# Patient Record
Sex: Female | Born: 1980 | Race: Asian | Hispanic: No | Marital: Single | Smoking: Current every day smoker
Health system: Southern US, Community
[De-identification: ages and names within clinical notes are randomized; demographics above are authoritative.]

## PROBLEM LIST (undated history)

## (undated) DIAGNOSIS — G5600 Carpal tunnel syndrome, unspecified upper limb: Secondary | ICD-10-CM

## (undated) DIAGNOSIS — R87629 Unspecified abnormal cytological findings in specimens from vagina: Secondary | ICD-10-CM

## (undated) DIAGNOSIS — J45909 Unspecified asthma, uncomplicated: Secondary | ICD-10-CM

## (undated) HISTORY — DX: Unspecified abnormal cytological findings in specimens from vagina: R87.629

## (undated) HISTORY — DX: Unspecified asthma, uncomplicated: J45.909

## (undated) HISTORY — PX: COLPOSCOPY: SHX161

## (undated) HISTORY — PX: TONSILLECTOMY: SUR1361

---
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
## 2004-09-15 ENCOUNTER — Emergency Department (HOSPITAL_COMMUNITY): Admission: EM | Admit: 2004-09-15 | Discharge: 2004-09-15 | Payer: Self-pay | Admitting: Family Medicine

## 2004-12-13 ENCOUNTER — Emergency Department (HOSPITAL_COMMUNITY): Admission: EM | Admit: 2004-12-13 | Discharge: 2004-12-13 | Payer: Self-pay | Admitting: *Deleted

## 2005-08-08 ENCOUNTER — Emergency Department (HOSPITAL_COMMUNITY): Admission: EM | Admit: 2005-08-08 | Discharge: 2005-08-08 | Payer: Self-pay | Admitting: Emergency Medicine

## 2005-10-11 ENCOUNTER — Inpatient Hospital Stay (HOSPITAL_COMMUNITY): Admission: AD | Admit: 2005-10-11 | Discharge: 2005-10-12 | Payer: Self-pay | Admitting: *Deleted

## 2005-10-19 ENCOUNTER — Inpatient Hospital Stay (HOSPITAL_COMMUNITY): Admission: RE | Admit: 2005-10-19 | Discharge: 2005-10-19 | Payer: Self-pay | Admitting: *Deleted

## 2005-12-27 ENCOUNTER — Inpatient Hospital Stay (HOSPITAL_COMMUNITY): Admission: AD | Admit: 2005-12-27 | Discharge: 2005-12-27 | Payer: Self-pay | Admitting: *Deleted

## 2006-01-10 ENCOUNTER — Ambulatory Visit: Payer: Self-pay | Admitting: *Deleted

## 2006-01-10 ENCOUNTER — Ambulatory Visit (HOSPITAL_COMMUNITY): Admission: RE | Admit: 2006-01-10 | Discharge: 2006-01-10 | Payer: Self-pay | Admitting: *Deleted

## 2006-01-30 ENCOUNTER — Ambulatory Visit (HOSPITAL_COMMUNITY): Admission: RE | Admit: 2006-01-30 | Discharge: 2006-01-30 | Payer: Self-pay | Admitting: *Deleted

## 2006-03-13 ENCOUNTER — Ambulatory Visit (HOSPITAL_COMMUNITY): Admission: RE | Admit: 2006-03-13 | Discharge: 2006-03-13 | Payer: Self-pay | Admitting: Obstetrics & Gynecology

## 2006-04-26 ENCOUNTER — Inpatient Hospital Stay (HOSPITAL_COMMUNITY): Admission: AD | Admit: 2006-04-26 | Discharge: 2006-04-26 | Payer: Self-pay | Admitting: Gynecology

## 2006-04-26 ENCOUNTER — Ambulatory Visit: Payer: Self-pay | Admitting: Certified Nurse Midwife

## 2006-06-01 ENCOUNTER — Inpatient Hospital Stay (HOSPITAL_COMMUNITY): Admission: AD | Admit: 2006-06-01 | Discharge: 2006-06-02 | Payer: Self-pay | Admitting: Obstetrics & Gynecology

## 2006-06-08 ENCOUNTER — Ambulatory Visit: Payer: Self-pay | Admitting: Family Medicine

## 2006-06-08 ENCOUNTER — Inpatient Hospital Stay (HOSPITAL_COMMUNITY): Admission: AD | Admit: 2006-06-08 | Discharge: 2006-06-10 | Payer: Self-pay | Admitting: Obstetrics and Gynecology

## 2009-01-05 ENCOUNTER — Emergency Department (HOSPITAL_COMMUNITY): Admission: EM | Admit: 2009-01-05 | Discharge: 2009-01-05 | Payer: Self-pay | Admitting: Family Medicine

## 2009-04-06 ENCOUNTER — Emergency Department (HOSPITAL_COMMUNITY): Admission: EM | Admit: 2009-04-06 | Discharge: 2009-04-06 | Payer: Self-pay | Admitting: Family Medicine

## 2009-04-23 ENCOUNTER — Encounter: Admission: RE | Admit: 2009-04-23 | Discharge: 2009-05-15 | Payer: Self-pay | Admitting: Orthopedic Surgery

## 2009-07-23 ENCOUNTER — Emergency Department (HOSPITAL_COMMUNITY): Admission: EM | Admit: 2009-07-23 | Discharge: 2009-07-23 | Payer: Self-pay | Admitting: Emergency Medicine

## 2009-12-11 ENCOUNTER — Emergency Department (HOSPITAL_COMMUNITY): Admission: EM | Admit: 2009-12-11 | Discharge: 2009-12-11 | Payer: Self-pay | Admitting: Family Medicine

## 2010-01-04 ENCOUNTER — Ambulatory Visit (HOSPITAL_COMMUNITY): Admission: RE | Admit: 2010-01-04 | Discharge: 2010-01-04 | Payer: Self-pay | Admitting: Orthopedic Surgery

## 2010-12-30 LAB — POCT URINALYSIS DIP (DEVICE)
Bilirubin Urine: NEGATIVE
Glucose, UA: NEGATIVE mg/dL
Hgb urine dipstick: NEGATIVE
Ketones, ur: NEGATIVE mg/dL
Nitrite: NEGATIVE
Protein, ur: NEGATIVE mg/dL
Specific Gravity, Urine: 1.015 (ref 1.005–1.030)
Urobilinogen, UA: 0.2 mg/dL (ref 0.0–1.0)
pH: 6 (ref 5.0–8.0)

## 2010-12-30 LAB — POCT PREGNANCY, URINE: Preg Test, Ur: NEGATIVE

## 2011-06-01 ENCOUNTER — Encounter (HOSPITAL_COMMUNITY)
Admission: RE | Admit: 2011-06-01 | Discharge: 2011-06-01 | Disposition: A | Discharge status: Home / Self Care | Payer: Medicaid Other | Source: Ambulatory Visit | Attending: Otolaryngology | Admitting: Otolaryngology

## 2011-06-01 LAB — URINALYSIS, ROUTINE W REFLEX MICROSCOPIC
Bilirubin Urine: NEGATIVE
Glucose, UA: NEGATIVE mg/dL
Ketones, ur: NEGATIVE mg/dL
Nitrite: NEGATIVE
Protein, ur: NEGATIVE mg/dL
Specific Gravity, Urine: 1.025 (ref 1.005–1.030)
Urobilinogen, UA: 0.2 mg/dL (ref 0.0–1.0)
pH: 5.5 (ref 5.0–8.0)

## 2011-06-01 LAB — URINE MICROSCOPIC-ADD ON

## 2011-06-01 LAB — SURGICAL PCR SCREEN
MRSA, PCR: NEGATIVE
Staphylococcus aureus: NEGATIVE

## 2011-06-01 LAB — CBC
HCT: 42.7 % (ref 36.0–46.0)
Hemoglobin: 15 g/dL (ref 12.0–15.0)
MCH: 31.8 pg (ref 26.0–34.0)
MCHC: 35.1 g/dL (ref 30.0–36.0)
MCV: 90.5 fL (ref 78.0–100.0)
Platelets: 322 10*3/uL (ref 150–400)
RBC: 4.72 MIL/uL (ref 3.87–5.11)
RDW: 11.7 % (ref 11.5–15.5)
WBC: 13 10*3/uL — ABNORMAL HIGH (ref 4.0–10.5)

## 2011-06-01 LAB — HCG, SERUM, QUALITATIVE: Preg, Serum: NEGATIVE

## 2011-06-06 ENCOUNTER — Other Ambulatory Visit: Payer: Self-pay | Admitting: Otolaryngology

## 2011-06-06 ENCOUNTER — Ambulatory Visit (HOSPITAL_COMMUNITY)
Admission: RE | Admit: 2011-06-06 | Discharge: 2011-06-07 | Disposition: A | Discharge status: Home / Self Care | Payer: Medicaid Other | Source: Ambulatory Visit | Attending: Otolaryngology | Admitting: Otolaryngology

## 2011-06-06 DIAGNOSIS — J342 Deviated nasal septum: Secondary | ICD-10-CM | POA: Insufficient documentation

## 2011-06-06 DIAGNOSIS — F17200 Nicotine dependence, unspecified, uncomplicated: Secondary | ICD-10-CM | POA: Insufficient documentation

## 2011-06-06 DIAGNOSIS — F411 Generalized anxiety disorder: Secondary | ICD-10-CM | POA: Insufficient documentation

## 2011-06-06 DIAGNOSIS — J3501 Chronic tonsillitis: Secondary | ICD-10-CM | POA: Insufficient documentation

## 2011-06-06 DIAGNOSIS — J309 Allergic rhinitis, unspecified: Secondary | ICD-10-CM | POA: Insufficient documentation

## 2011-06-06 DIAGNOSIS — Z01812 Encounter for preprocedural laboratory examination: Secondary | ICD-10-CM | POA: Insufficient documentation

## 2011-06-06 DIAGNOSIS — J343 Hypertrophy of nasal turbinates: Secondary | ICD-10-CM | POA: Insufficient documentation
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
## 2011-06-17 NOTE — Discharge Summary (Signed)
[name], [name] NO.:  [id number]  MEDICAL RECORD NO.:  [id number]  LOCATION:  5121                         FACILITY:  [facility]  PHYSICIAN:  Hermelinda Medicus, M.D.   DATE OF BIRTH:  01-05-1981  DATE OF ADMISSION:  06/06/2011 DATE OF DISCHARGE:  06/07/2011                              DISCHARGE SUMMARY   This patient is a 30 year old female who has had considerable allergy issues.  She had been seen by Dr. 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  She also has very large tonsils and they have been infected at intermittent times having tonsillitis, but also the size of the tonsils have led to snoring issues plus further issues resolving her breathing.  She now enters for a septal reconstruction, turbinate reduction, and a tonsillectomy.  She will be on 23-hour observation because of her history of snoring and the potential airway issues.  She will be on pulse oximeter.  FAMILY HISTORY:  History of breast cancer and blood pressure elevation.  REVIEW OF SYSTEMS:  Unremarkable.  She is a healthy lady except for the allergic rhinitis problems.  PHYSICAL EXAMINATION:  VITAL SIGNS:  Her blood pressure is 128/68, pulse 79.  Her weight is essentially normal. ENT:  Ears are clear.  Tympanic membranes are moving well and are clear. The nose shows a severe septal deviation with deviation to the right and the left, typical of a previous hematoma within the septum in the ethmoid region.  She also has a lot of evidence of allergic rhinitis with turbinate hypertrophy and edema of her turbinates.  No evidence of any ulcer, mass, lesion, or polyps.  Nasopharynx is clear, but her tonsils are 3-4+ in size with a  small nose and a small oral cavity. NECK:  Free of any thyromegaly, cervical adenopathy, or mass, but she does have somewhat of a full neck which pushes her tongue high in her mouth.  Her oral cavity is otherwise clear. CHEST:  Clear.  No rales, rhonchi, or wheezes. CARDIOVASCULAR:  No murmurs or gallops. EXTREMITIES:  Unremarkable. NEUROLOGIC:  Unremarkable, oriented x3.  INITIAL DIAGNOSES: 1. Septal deviation. 2. Turbinate hypertrophy. 3. Allergic rhinitis. 4. Tonsillitis with tonsillar hypertrophy.  Her laboratory reveals a slightly high white count of 13.0 secondary to previous sinusitis.  She is negative for Staph aureus and MRSA.  The remainder of her lab really looks quite good.  She was taken to surgery as a 23-hour observation and underwent a septal reconstruction, turbinate reduction with reduction of concha bullosa which are very large hollow middle turbinates and septal reconstruction.  Also, she underwent a tonsillectomy.  The patient had an anesthesia trumpet placed in her nose on one side and a Merocel pack in the other, was placed on 23-hour observation with continuous pulse oximeter and has done very well.  Packing was removed this morning.  Her lowest O2 was 94% on room air.  Her blood pressure now is 112/66, pulse 20, rate 56.  Per Cardiology evaluation, normal sinus rhythm and she is now going to be discharged and will be followed in my office in 3 days and then in 10 days and 3 weeks, 6 weeks, 3 months, and a year.  FINAL DIAGNOSES: 1. Septal deviation. 2. Turbinate hypertrophy. 3. Concha bullosa. 4. Allergic rhinitis with tonsillar hypertrophy with snoring with     tonsillitis.  The patient is doing very well.          ______________________________ Hermelinda Medicus, M.D.     JC/MEDQ  D:  06/07/2011  T:  06/07/2011  Job:  [id number]  Electronically Signed by Hermelinda Medicus M.D. on 06/17/2011 09:33:15 AM

## 2011-10-21 ENCOUNTER — Emergency Department (INDEPENDENT_AMBULATORY_CARE_PROVIDER_SITE_OTHER)
Admission: EM | Admit: 2011-10-21 | Discharge: 2011-10-21 | Disposition: A | Discharge status: Home / Self Care | Payer: Self-pay | Source: Home / Self Care | Attending: Emergency Medicine | Admitting: Emergency Medicine

## 2011-10-21 ENCOUNTER — Encounter (HOSPITAL_COMMUNITY): Payer: Self-pay | Admitting: *Deleted

## 2011-10-21 DIAGNOSIS — S139XXA Sprain of joints and ligaments of unspecified parts of neck, initial encounter: Secondary | ICD-10-CM

## 2011-10-21 DIAGNOSIS — S161XXA Strain of muscle, fascia and tendon at neck level, initial encounter: Secondary | ICD-10-CM

## 2011-10-21 MED ORDER — MELOXICAM 15 MG PO TABS: 15.0000 mg | ORAL_TABLET | Freq: Every day | ORAL | Status: DC
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
## 2011-10-21 NOTE — ED Notes (Signed)
pT  REPORTS  SYMPTOMS  OF  BACK  AND  NECK  PAIN  SHE  IS  SITTING  COMFORTABLY  IN CHAIR  SPEAKING IN  COMPLETE  SENTANCES  AND  IS  IN NO  DISTRESS
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
## 2011-10-21 NOTE — ED Provider Notes (Signed)
Medical screening examination/treatment/procedure(s) were performed by a resident physician and as supervising physician I was immediately available for consultation/collaboration.  Hillery Hunter, MD 10/21/11 [phone]

## 2012-01-25 ENCOUNTER — Ambulatory Visit
Payer: No Typology Code available for payment source | Attending: Family Medicine | Admitting: Rehabilitative and Restorative Service Providers"

## 2012-01-25 DIAGNOSIS — IMO0001 Reserved for inherently not codable concepts without codable children: Secondary | ICD-10-CM | POA: Insufficient documentation

## 2012-01-25 DIAGNOSIS — M2560 Stiffness of unspecified joint, not elsewhere classified: Secondary | ICD-10-CM | POA: Insufficient documentation

## 2012-01-25 DIAGNOSIS — R293 Abnormal posture: Secondary | ICD-10-CM | POA: Insufficient documentation

## 2012-01-25 DIAGNOSIS — M542 Cervicalgia: Secondary | ICD-10-CM | POA: Insufficient documentation

## 2012-01-30 ENCOUNTER — Encounter: Payer: Self-pay | Admitting: Physical Therapy

## 2012-02-01 ENCOUNTER — Encounter: Payer: Self-pay | Admitting: Physical Therapy

## 2012-02-06 ENCOUNTER — Ambulatory Visit: Payer: No Typology Code available for payment source | Admitting: Physical Therapy

## 2012-02-08 ENCOUNTER — Ambulatory Visit: Payer: No Typology Code available for payment source

## 2012-02-14 ENCOUNTER — Ambulatory Visit: Payer: No Typology Code available for payment source | Admitting: Rehabilitative and Restorative Service Providers"

## 2012-02-16 ENCOUNTER — Encounter: Payer: Self-pay | Admitting: Rehabilitative and Restorative Service Providers"

## 2012-02-21 ENCOUNTER — Ambulatory Visit: Payer: No Typology Code available for payment source | Admitting: Rehabilitative and Restorative Service Providers"

## 2012-02-23 ENCOUNTER — Encounter: Payer: Self-pay | Admitting: Rehabilitation

## 2012-02-27 ENCOUNTER — Ambulatory Visit
Payer: No Typology Code available for payment source | Attending: Family Medicine | Admitting: Rehabilitative and Restorative Service Providers"

## 2012-02-27 DIAGNOSIS — M542 Cervicalgia: Secondary | ICD-10-CM | POA: Insufficient documentation

## 2012-02-27 DIAGNOSIS — M2560 Stiffness of unspecified joint, not elsewhere classified: Secondary | ICD-10-CM | POA: Insufficient documentation

## 2012-02-27 DIAGNOSIS — R293 Abnormal posture: Secondary | ICD-10-CM | POA: Insufficient documentation

## 2012-02-27 DIAGNOSIS — IMO0001 Reserved for inherently not codable concepts without codable children: Secondary | ICD-10-CM | POA: Insufficient documentation

## 2012-02-29 ENCOUNTER — Ambulatory Visit: Payer: No Typology Code available for payment source | Admitting: Rehabilitative and Restorative Service Providers"

## 2012-03-07 ENCOUNTER — Ambulatory Visit: Payer: No Typology Code available for payment source | Admitting: Rehabilitative and Restorative Service Providers"

## 2012-03-09 ENCOUNTER — Ambulatory Visit: Payer: No Typology Code available for payment source

## 2012-03-13 ENCOUNTER — Ambulatory Visit: Payer: No Typology Code available for payment source

## 2012-03-15 ENCOUNTER — Ambulatory Visit: Payer: No Typology Code available for payment source

## 2012-05-03 ENCOUNTER — Ambulatory Visit: Payer: No Typology Code available for payment source | Admitting: Physical Therapy

## 2012-06-17 ENCOUNTER — Emergency Department (HOSPITAL_COMMUNITY)
Admission: EM | Admit: 2012-06-17 | Discharge: 2012-06-17 | Disposition: A | Discharge status: Home / Self Care | Payer: Medicaid Other | Attending: Emergency Medicine | Admitting: Emergency Medicine

## 2012-06-17 ENCOUNTER — Encounter (HOSPITAL_COMMUNITY): Payer: Self-pay | Admitting: *Deleted

## 2012-06-17 ENCOUNTER — Emergency Department (HOSPITAL_COMMUNITY): Payer: Medicaid Other

## 2012-06-17 DIAGNOSIS — X500XXA Overexertion from strenuous movement or load, initial encounter: Secondary | ICD-10-CM | POA: Insufficient documentation

## 2012-06-17 DIAGNOSIS — Y9301 Activity, walking, marching and hiking: Secondary | ICD-10-CM | POA: Insufficient documentation

## 2012-06-17 DIAGNOSIS — S93409A Sprain of unspecified ligament of unspecified ankle, initial encounter: Secondary | ICD-10-CM

## 2012-06-17 DIAGNOSIS — Y998 Other external cause status: Secondary | ICD-10-CM | POA: Insufficient documentation

## 2012-06-17 DIAGNOSIS — F17200 Nicotine dependence, unspecified, uncomplicated: Secondary | ICD-10-CM | POA: Insufficient documentation

## 2012-06-17 DIAGNOSIS — S8990XA Unspecified injury of unspecified lower leg, initial encounter: Secondary | ICD-10-CM | POA: Insufficient documentation

## 2012-06-17 MED ORDER — IBUPROFEN 800 MG PO TABS: 800.0000 mg | ORAL_TABLET | Freq: Three times a day (TID) | ORAL | Status: DC | PRN

## 2012-06-17 NOTE — ED Provider Notes (Signed)
History     CSN: [id number]  Arrival date & time 06/17/12  0520   First MD Initiated Contact with Patient 06/17/12 0831      Chief Complaint  Patient presents with  . Ankle Pain    (Consider location/radiation/quality/duration/timing/severity/associated sxs/prior treatment) HPI Patient presents to the emergency department with right ankle pain, following a fall.  Patient, states, that she was walking when she twisted her ankle and fell.  Patient, states, that she's not able to put weight on her right ankle.  The patient denies numbness or weakness in her foot.  Patient did not take any medications prior to arrival, for her discomfort.  Patient, states, that movement and palpation make the pain, worse.     Marland KitchenHistory reviewed. No pertinent past medical history.  Past Surgical History  Procedure Date  . Tonsillectomy     History reviewed. No pertinent family history.  History  Substance Use Topics  . Smoking status: Current Every Day Smoker  . Smokeless tobacco: Not on file  . Alcohol Use: Yes    OB History    Grav Para Term Preterm Abortions TAB SAB Ect Mult Living                  Review of Systems All other systems negative except as documented in the HPI. All pertinent positives and negatives as reviewed in the HPI.  Allergies  Review of patient's allergies indicates no known allergies.  Home Medications  No current outpatient prescriptions on file.  BP 128/73  Pulse 99  Temp 98.9 F (37.2 C) (Oral)  Resp 17  Ht 5\' 5"  (1.651 m)  Wt 215 lb (97.523 kg)  BMI 35.78 kg/m2  SpO2 95%  Physical Exam  Nursing note and vitals reviewed. HENT:  Head: Normocephalic and atraumatic.  Cardiovascular: Normal rate and regular rhythm.   Pulmonary/Chest: Effort normal and breath sounds normal.  Musculoskeletal:       Right ankle: She exhibits decreased range of motion and swelling. She exhibits no ecchymosis, no deformity and normal pulse. tenderness. Lateral  malleolus tenderness found. Achilles tendon normal.    ED Course  Procedures (including critical care time)  Labs Reviewed - No data to display Dg Ankle Complete Right  06/17/2012  *RADIOLOGY REPORT*  Clinical Data: Ankle pain  RIGHT ANKLE - COMPLETE 3+ VIEW  Comparison: None  Findings: There is no evidence of fracture or dislocation.  There is no evidence of arthropathy or other focal bone abnormality. Soft tissues are unremarkable.  IMPRESSION: Negative exam.   Original Report Authenticated By: Rosealee Albee, M.D.     The patient will be seen by the orthopedist this coming week for some back troubles and I advised her to followup with him for this as well.  Patient is placed in an ASO with crutches.  She is told to return here for any worsening in her condition.     MDM          Carlyle Dolly, PA-C 06/17/12 [phone]
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
## 2012-06-17 NOTE — ED Provider Notes (Signed)
Medical screening examination/treatment/procedure(s) were performed by non-physician practitioner and as supervising physician I was immediately available for consultation/collaboration.  Asley Baskerville T Ebony Yorio, MD 06/17/12 1503 

## 2012-06-17 NOTE — ED Notes (Signed)
Splint applied and crutch walking, pt able to demonstrate crutch walking, aware to follow up with ortho

## 2013-02-05 ENCOUNTER — Emergency Department (HOSPITAL_COMMUNITY)
Admission: EM | Admit: 2013-02-05 | Discharge: 2013-02-05 | Disposition: A | Discharge status: Home / Self Care | Payer: Medicaid Other | Source: Home / Self Care

## 2013-02-05 ENCOUNTER — Encounter (HOSPITAL_COMMUNITY): Payer: Self-pay | Admitting: *Deleted

## 2013-02-05 DIAGNOSIS — L253 Unspecified contact dermatitis due to other chemical products: Secondary | ICD-10-CM

## 2013-02-05 MED ORDER — FLUTICASONE PROPIONATE 0.05 % EX CREA: TOPICAL_CREAM | Freq: Two times a day (BID) | CUTANEOUS | Status: DC

## 2013-02-05 NOTE — ED Provider Notes (Signed)
History     CSN: [id number]  Arrival date & time 02/05/13  1547   None     Chief Complaint  Patient presents with  . Rash    (Consider location/radiation/quality/duration/timing/severity/associated sxs/prior treatment) Patient is a 32 y.o. female presenting with rash. The history is provided by the patient.  Rash Location:  Hand Hand rash location:  L hand and R hand Quality: itchiness and redness   Severity:  Mild Duration:  2 weeks Timing:  Constant Progression:  Unchanged Chronicity:  New Relieved by:  None tried   History reviewed. No pertinent past medical history.  Past Surgical History  Procedure Laterality Date  . Tonsillectomy      Family History  Problem Relation Age of Onset  . Cancer Mother   . Hypertension Mother     History  Substance Use Topics  . Smoking status: Current Every Day Smoker  . Smokeless tobacco: Never Used  . Alcohol Use: Yes    OB History   Grav Para Term Preterm Abortions TAB SAB Ect Mult Living                  Review of Systems  Constitutional: Negative.   Skin: Positive for rash.    Allergies  Review of patient's allergies indicates no known allergies.  Home Medications   Current Outpatient Rx  Name  Route  Sig  Dispense  Refill  . fluticasone (CUTIVATE) 0.05 % cream   Topical   Apply topically 2 (two) times daily.   30 g   1   . ibuprofen (ADVIL,MOTRIN) 800 MG tablet   Oral   Take 1 tablet (800 mg total) by mouth every 8 (eight) hours as needed for pain.   21 tablet   0     BP 109/83  Pulse 80  Temp(Src) 97.5 F (36.4 C) (Oral)  SpO2 92%  Physical Exam  Nursing note and vitals reviewed. Constitutional: She is oriented to person, place, and time. She appears well-developed and well-nourished.  Neurological: She is alert and oriented to person, place, and time.  Skin: Skin is warm and dry. Rash noted.  Mild erythema between fingers of both hands, no discrete blisters or lesions.    ED Course   Procedures (including critical care time)  Labs Reviewed - No data to display No results found.   1. Contact dermatitis due to chemicals       MDM          Linna Hoff, MD 02/05/13 1730

## 2013-02-05 NOTE — ED Notes (Signed)
Itching rash between fingers x 2 weeks

## 2013-10-13 ENCOUNTER — Encounter (HOSPITAL_COMMUNITY): Payer: Self-pay | Admitting: Emergency Medicine

## 2013-10-13 ENCOUNTER — Emergency Department (HOSPITAL_COMMUNITY)
Admission: EM | Admit: 2013-10-13 | Discharge: 2013-10-13 | Disposition: A | Discharge status: Home / Self Care | Payer: Medicaid Other | Source: Home / Self Care | Attending: Emergency Medicine | Admitting: Emergency Medicine

## 2013-10-13 DIAGNOSIS — J329 Chronic sinusitis, unspecified: Secondary | ICD-10-CM

## 2013-10-13 MED ORDER — AMOXICILLIN 500 MG PO CAPS: 500.0000 mg | ORAL_CAPSULE | Freq: Two times a day (BID) | ORAL | Status: DC

## 2013-10-13 NOTE — Discharge Instructions (Signed)
Use your saline rinses and fluticasone nasal spray. Get some sudafed (generic pseudoephedrine is ok to use) from the pharmacist and take it as directed on the package. Finish all of the antibiotics, even if you are feeling better.    Sinusitis Sinusitis is redness, soreness, and puffiness (inflammation) of the air pockets in the bones of your face (sinuses). The redness, soreness, and puffiness can cause air and mucus to get trapped in your sinuses. This can allow germs to grow and cause an infection.  HOME CARE   Drink enough fluids to keep your pee (urine) clear or pale yellow.  Use a humidifier in your home.  Run a hot shower to create steam in the bathroom. Sit in the bathroom with the door closed. Breathe in the steam 3 4 times a day.  Put a warm, moist washcloth on your face 3 4 times a day, or as told by your doctor.  Use salt water sprays (saline sprays) to wet the thick fluid in your nose. This can help the sinuses drain.  Only take medicine as told by your doctor. GET HELP RIGHT AWAY IF:   Your pain gets worse.  You have very bad headaches.  You are sick to your stomach (nauseous).  You throw up (vomit).  You are very sleepy (drowsy) all the time.  Your face is puffy (swollen).  Your vision changes.  You have a stiff neck.  You have trouble breathing. MAKE SURE YOU:   Understand these instructions.  Will watch your condition.  Will get help right away if you are not doing well or get worse. Document Released: 02/29/2008 Document Revised: 06/06/2012 Document Reviewed: 04/17/2012 [facility] Patient Information 2014 [place].

## 2013-10-13 NOTE — ED Notes (Signed)
Reported 2 week duration of URI type syx that have not improved w OTC medications; used her sons MDI for her reported SOB earlier today. SPO2  95% on RA

## 2013-10-13 NOTE — ED Provider Notes (Signed)
CSN: [id number]     Arrival date & time 10/13/13  1650 History   First MD Initiated Contact with Patient 10/13/13 1805     Chief Complaint  Patient presents with  . URI   (Consider location/radiation/quality/duration/timing/severity/associated sxs/prior Treatment) Patient is a 33 y.o. female presenting with URI. The history is provided by the patient.  URI Presenting symptoms: congestion, rhinorrhea and sore throat   Presenting symptoms: no cough and no fever   Severity:  Severe Onset quality:  Gradual Duration:  2 weeks Timing:  Constant Progression:  Unchanged Chronicity:  New Relieved by:  Nothing Worsened by:  Nothing tried Ineffective treatments: nasal saline rinses. Associated symptoms: sinus pain     History reviewed. No pertinent past medical history. Past Surgical History  Procedure Laterality Date  . Tonsillectomy     Family History  Problem Relation Age of Onset  . Cancer Mother   . Hypertension Mother    History  Substance Use Topics  . Smoking status: Current Every Day Smoker  . Smokeless tobacco: Never Used  . Alcohol Use: Yes   OB History   Grav Para Term Preterm Abortions TAB SAB Ect Mult Living                 Review of Systems  Constitutional: Negative for fever and chills.  HENT: Positive for congestion, postnasal drip, rhinorrhea, sinus pressure and sore throat.   Respiratory: Negative for cough.     Allergies  Review of patient's allergies indicates no known allergies.  Home Medications   Current Outpatient Rx  Name  Route  Sig  Dispense  Refill  . amoxicillin (AMOXIL) 500 MG capsule   Oral   Take 1 capsule (500 mg total) by mouth 2 (two) times daily.   21 capsule   0   . fluticasone (CUTIVATE) 0.05 % cream   Topical   Apply topically 2 (two) times daily.   30 g   1   . ibuprofen (ADVIL,MOTRIN) 800 MG tablet   Oral   Take 1 tablet (800 mg total) by mouth every 8 (eight) hours as needed for pain.   21 tablet   0    BP  155/87  Pulse 81  Temp(Src) 98.2 F (36.8 C) (Oral)  Resp 18  SpO2 95% Physical Exam  Constitutional: She appears well-developed and well-nourished. No distress.  HENT:  Right Ear: External ear and ear canal normal. A middle ear effusion is present.  Left Ear: External ear and ear canal normal. A middle ear effusion is present.  Nose: Mucosal edema and rhinorrhea present. Right sinus exhibits maxillary sinus tenderness. Right sinus exhibits no frontal sinus tenderness. Left sinus exhibits maxillary sinus tenderness. Left sinus exhibits no frontal sinus tenderness.  Mouth/Throat: Oropharynx is clear and moist and mucous membranes are normal.  Purulent nasal discharge from R nare  Cardiovascular: Normal rate and regular rhythm.   Pulmonary/Chest: Effort normal and breath sounds normal.  Lymphadenopathy:       Head (right side): No submental, no submandibular and no tonsillar adenopathy present.       Head (left side): No submental, no submandibular and no tonsillar adenopathy present.    She has no cervical adenopathy.    ED Course  Procedures (including critical care time) Labs Review Labs Reviewed - No data to display Imaging Review No results found.  EKG Interpretation    Date/Time:    Ventricular Rate:    PR Interval:    QRS Duration:  QT Interval:    QTC Calculation:   R Axis:     Text Interpretation:              MDM   1. Sinusitis   rx amoxicillin 500mg  BID #20. Recommended pt use her nasal rinses and fluticasone nasal spray and suggested she add sudafed.      Carvel Getting, NP 10/13/13 1810

## 2013-10-13 NOTE — ED Provider Notes (Signed)
Medical screening examination/treatment/procedure(s) were performed by non-physician practitioner and as supervising physician I was immediately available for consultation/collaboration.  Philipp Deputy, M.D.  Harden Mo, MD 10/13/13 [phone]

## 2013-11-05 ENCOUNTER — Encounter (HOSPITAL_COMMUNITY): Payer: Self-pay | Admitting: Emergency Medicine

## 2013-11-05 ENCOUNTER — Emergency Department (HOSPITAL_COMMUNITY)
Admission: EM | Admit: 2013-11-05 | Discharge: 2013-11-05 | Disposition: A | Discharge status: Home / Self Care | Payer: Medicaid Other | Source: Home / Self Care | Attending: Family Medicine | Admitting: Family Medicine

## 2013-11-05 DIAGNOSIS — J019 Acute sinusitis, unspecified: Secondary | ICD-10-CM

## 2013-11-05 MED ORDER — IPRATROPIUM BROMIDE 0.06 % NA SOLN: 2.0000 | Freq: Four times a day (QID) | NASAL | Status: DC

## 2013-11-05 MED ORDER — METHYLPREDNISOLONE ACETATE 40 MG/ML IJ SUSP
80.0000 mg | Freq: Once | INTRAMUSCULAR | Status: AC
  Administered 2013-11-05: 80 mg via INTRAMUSCULAR

## 2013-11-05 MED ORDER — METHYLPREDNISOLONE ACETATE 80 MG/ML IJ SUSP
INTRAMUSCULAR | Status: AC
  Filled 2013-11-05: qty 1

## 2013-11-05 MED ORDER — MINOCYCLINE HCL 100 MG PO CAPS: 100.0000 mg | ORAL_CAPSULE | Freq: Two times a day (BID) | ORAL | Status: DC

## 2013-11-05 NOTE — ED Provider Notes (Signed)
CSN: [id number]     Arrival date & time 11/05/13  1143 History   First MD Initiated Contact with Patient 11/05/13 1304     Chief Complaint  Patient presents with  . URI     (Consider location/radiation/quality/duration/timing/severity/associated sxs/prior Treatment) Patient is a 33 y.o. female presenting with URI. The history is provided by the patient.  URI Presenting symptoms: congestion, cough and rhinorrhea   Presenting symptoms: no fever and no sore throat   Severity:  Moderate Onset quality:  Sudden Duration:  1 month Progression:  Worsening Chronicity:  New Associated symptoms: sinus pain     History reviewed. No pertinent past medical history. Past Surgical History  Procedure Laterality Date  . Tonsillectomy     Family History  Problem Relation Age of Onset  . Cancer Mother   . Hypertension Mother    History  Substance Use Topics  . Smoking status: Current Every Day Smoker  . Smokeless tobacco: Never Used  . Alcohol Use: Yes   OB History   Grav Para Term Preterm Abortions TAB SAB Ect Mult Living                 Review of Systems  Constitutional: Negative.  Negative for fever.  HENT: Positive for congestion, postnasal drip and rhinorrhea. Negative for sore throat.   Respiratory: Positive for cough.       Allergies  Review of patient's allergies indicates no known allergies.  Home Medications   Current Outpatient Rx  Name  Route  Sig  Dispense  Refill  . amoxicillin (AMOXIL) 500 MG capsule   Oral   Take 1 capsule (500 mg total) by mouth 2 (two) times daily.   21 capsule   0   . fluticasone (CUTIVATE) 0.05 % cream   Topical   Apply topically 2 (two) times daily.   30 g   1   . ibuprofen (ADVIL,MOTRIN) 800 MG tablet   Oral   Take 1 tablet (800 mg total) by mouth every 8 (eight) hours as needed for pain.   21 tablet   0   . ipratropium (ATROVENT) 0.06 % nasal spray   Each Nare   Place 2 sprays into both nostrils 4 (four) times daily.  15 mL   1   . minocycline (MINOCIN,DYNACIN) 100 MG capsule   Oral   Take 1 capsule (100 mg total) by mouth 2 (two) times daily.   20 capsule   0    BP 115/78  Pulse 72  Temp(Src) 97.5 F (36.4 C) (Oral)  Resp 16  SpO2 96% Physical Exam  Nursing note and vitals reviewed. Constitutional: She is oriented to person, place, and time. She appears well-developed and well-nourished. No distress.  HENT:  Head: Normocephalic.  Right Ear: External ear normal.  Left Ear: External ear normal.  Nose: Mucosal edema and rhinorrhea present. Right sinus exhibits maxillary sinus tenderness. Left sinus exhibits maxillary sinus tenderness.  Mouth/Throat: Oropharynx is clear and moist.  Eyes: Pupils are equal, round, and reactive to light.  Neck: Normal range of motion. Neck supple.  Cardiovascular: Normal rate, regular rhythm, normal heart sounds and intact distal pulses.   Pulmonary/Chest: Breath sounds normal.  Lymphadenopathy:    She has no cervical adenopathy.  Neurological: She is alert and oriented to person, place, and time.  Skin: Skin is warm and dry.    ED Course  Procedures (including critical care time) Labs Review Labs Reviewed - No data to display Imaging Review No results  found.    MDM   Final diagnoses:  Sinusitis, acute        Billy Fischer, MD 11/05/13 1320

## 2013-11-05 NOTE — ED Notes (Signed)
Reports being seen 2 weeks ago with the same symptoms.  Treated with amoxicillin.  Reports no improvement.  Reports stuffiness/runny nose, slight wheeze in chest at night. C/o sneezing, blood tinged sinus secretions

## 2014-01-27 ENCOUNTER — Emergency Department (HOSPITAL_COMMUNITY)
Admission: EM | Admit: 2014-01-27 | Discharge: 2014-01-27 | Disposition: A | Discharge status: Home / Self Care | Payer: Medicaid Other | Attending: Emergency Medicine | Admitting: Emergency Medicine

## 2014-01-27 ENCOUNTER — Encounter (HOSPITAL_COMMUNITY): Payer: Self-pay | Admitting: Emergency Medicine

## 2014-01-27 DIAGNOSIS — J309 Allergic rhinitis, unspecified: Secondary | ICD-10-CM | POA: Insufficient documentation

## 2014-01-27 DIAGNOSIS — Z79899 Other long term (current) drug therapy: Secondary | ICD-10-CM | POA: Insufficient documentation

## 2014-01-27 DIAGNOSIS — F17200 Nicotine dependence, unspecified, uncomplicated: Secondary | ICD-10-CM | POA: Insufficient documentation

## 2014-01-27 MED ORDER — ALBUTEROL SULFATE HFA 108 (90 BASE) MCG/ACT IN AERS: 2.0000 | INHALATION_SPRAY | RESPIRATORY_TRACT | Status: DC | PRN

## 2014-01-27 MED ORDER — TRIAMCINOLONE ACETONIDE 55 MCG/ACT NA AERO: 2.0000 | INHALATION_SPRAY | Freq: Every day | NASAL | Status: DC

## 2014-01-27 MED ORDER — LORATADINE 10 MG PO TABS: 10.0000 mg | ORAL_TABLET | Freq: Every day | ORAL | Status: DC

## 2014-01-27 NOTE — ED Notes (Addendum)
Pt states that she has been having chest tightness, wheezing, cough and nasal congestion for past month. Pt NAD. Pt reports wheezing at home. None upon arrival.

## 2014-01-27 NOTE — ED Provider Notes (Signed)
Medical screening examination/treatment/procedure(s) were performed by non-physician practitioner and as supervising physician I was immediately available for consultation/collaboration.  Richarda Blade, MD 01/27/14 [phone]

## 2014-01-27 NOTE — Discharge Instructions (Signed)
Allergies  Allergies may happen from anything your body is sensitive to. This may be food, medicines, pollens, chemicals, and many other things. Food allergies can be severe and deadly.  HOME CARE  If you do not know what causes a reaction, keep a diary. Write down the foods you ate and the symptoms that followed. Avoid foods that cause reactions.  If you have red raised spots (hives) or a rash:  Take medicine as told by your doctor.  Use medicines for red raised spots and itching as needed.  Apply cold cloths (compresses) to the skin. Take a cool bath. Avoid hot baths or showers.  If you are severely allergic:  It is often necessary to go to the hospital after you have treated your reaction.  Wear your medical alert jewelry.  You and your family must learn how to give a allergy shot or use an allergy kit (anaphylaxis kit).  Always carry your allergy kit or shot with you. Use this medicine as told by your doctor if a severe reaction is occurring. GET HELP RIGHT AWAY IF:  You have trouble breathing or are making high-pitched whistling sounds (wheezing).  You have a tight feeling in your chest or throat.  You have a puffy (swollen) mouth.  You have red raised spots, puffiness (swelling), or itching all over your body.  You have had a severe reaction that was helped by your allergy kit or shot. The reaction can return once the medicine has worn off.  You think you are having a food allergy. Symptoms most often happen within 30 minutes of eating a food.  Your symptoms have not gone away within 2 days or are getting worse.  You have new symptoms.  You want to retest yourself with a food or drink you think causes an allergic reaction. Only do this under the care of a doctor. MAKE SURE YOU:   Understand these instructions.  Will watch your condition.  Will get help right away if you are not doing well or get worse. Document Released: 01/07/2013 Document Reviewed:  01/07/2013 [facility] Patient Information 2014 [place], [place].  Allergic Rhinitis Allergic rhinitis is when the mucous membranes in the nose respond to allergens. Allergens are particles in the air that cause your body to have an allergic reaction. This causes you to release allergic antibodies. Through a chain of events, these eventually cause you to release histamine into the blood stream. Although meant to protect the body, it is this release of histamine that causes your discomfort, such as frequent sneezing, congestion, and an itchy, runny nose.  CAUSES  Seasonal allergic rhinitis (hay fever) is caused by pollen allergens that may come from grasses, trees, and weeds. Year-round allergic rhinitis (perennial allergic rhinitis) is caused by allergens such as house dust mites, pet dander, and mold spores.  SYMPTOMS   Nasal stuffiness (congestion).  Itchy, runny nose with sneezing and tearing of the eyes. DIAGNOSIS  Your health care provider can help you determine the allergen or allergens that trigger your symptoms. If you and your health care provider are unable to determine the allergen, skin or blood testing may be used. TREATMENT  Allergic Rhinitis does not have a cure, but it can be controlled by:  Medicines and allergy shots (immunotherapy).  Avoiding the allergen. Hay fever may often be treated with antihistamines in pill or nasal spray forms. Antihistamines block the effects of histamine. There are over-the-counter medicines that may help with nasal congestion and swelling around the eyes. Check  with your health care provider before taking or giving this medicine.  If avoiding the allergen or the medicine prescribed do not work, there are many new medicines your health care provider can prescribe. Stronger medicine may be used if initial measures are ineffective. Desensitizing injections can be used if medicine and avoidance does not work. Desensitization is when a patient is given  ongoing shots until the body becomes less sensitive to the allergen. Make sure you follow up with your health care provider if problems continue. HOME CARE INSTRUCTIONS It is not possible to completely avoid allergens, but you can reduce your symptoms by taking steps to limit your exposure to them. It helps to know exactly what you are allergic to so that you can avoid your specific triggers. SEEK MEDICAL CARE IF:   You have a fever.  You develop a cough that does not stop easily (persistent).  You have shortness of breath.  You start wheezing.  Symptoms interfere with normal daily activities. Document Released: 06/07/2001 Document Revised: 07/03/2013 Document Reviewed: 05/20/2013 [facility] Patient Information 2014 [place].

## 2014-01-27 NOTE — ED Provider Notes (Signed)
CSN: [id number]     Arrival date & time 01/27/14  0946 History   First MD Initiated Contact with Patient 01/27/14 1038     Chief Complaint  Patient presents with  . Shortness of Breath  . Nasal Congestion  . Cough     (Consider location/radiation/quality/duration/timing/severity/associated sxs/prior Treatment) HPI Comments: The patient is an otherwise healthy 33 year old female who presents today with shortness of breath, nasal congestion, cough since January. She reports that she has been seen intermittently for this problem and given amoxicillin and a nasal spray. She had little relief with these medications. She states the nasal spray did not work because she is too congested. She feels that her symptoms are improved by staying at other people to home. Her symptoms are worse in her own home. She has a known mold problem in her home. She states that she has shortness of breath which is worse at night and associated with a dry cough.  She has been using her son's albuterol inhaler with some relief of her symptoms. No fevers, chills, nausea, vomiting, abdominal pain, myalgias. She denies sore throat, ear pain, chest pain. No pleuritic pain.   The history is provided by the patient. No language interpreter was used.    History reviewed. No pertinent past medical history. Past Surgical History  Procedure Laterality Date  . Tonsillectomy     Family History  Problem Relation Age of Onset  . Cancer Mother   . Hypertension Mother    History  Substance Use Topics  . Smoking status: Current Every Day Smoker  . Smokeless tobacco: Never Used  . Alcohol Use: Yes   OB History   Grav Para Term Preterm Abortions TAB SAB Ect Mult Living                 Review of Systems  Constitutional: Negative for fever and chills.  HENT: Positive for congestion. Negative for ear pain and sore throat.   Respiratory: Positive for cough and shortness of breath.   Cardiovascular: Negative for chest pain.   Gastrointestinal: Negative for nausea, vomiting and abdominal pain.  All other systems reviewed and are negative.     Allergies  Review of patient's allergies indicates no known allergies.  Home Medications   Prior to Admission medications   Medication Sig Start Date End Date Taking? Authorizing Provider  albuterol (PROVENTIL HFA;VENTOLIN HFA) 108 (90 BASE) MCG/ACT inhaler Inhale 1 puff into the lungs every 6 (six) hours as needed for wheezing or shortness of breath.   Yes Historical Provider, MD   BP 117/81  Pulse 68  Temp(Src) 97.6 F (36.4 C) (Oral)  Resp 16  SpO2 96% Physical Exam  Nursing note and vitals reviewed. Constitutional: She is oriented to person, place, and time. She appears well-developed and well-nourished. No distress.  HENT:  Head: Normocephalic and atraumatic.  Right Ear: Tympanic membrane, external ear and ear canal normal.  Left Ear: Tympanic membrane, external ear and ear canal normal.  Nose: Nose normal.  Mouth/Throat: Uvula is midline, oropharynx is clear and moist and mucous membranes are normal. No trismus in the jaw.  Swollen nasal turbinates bilaterally.   Eyes: Conjunctivae are normal.  Neck: Normal range of motion.  No nuchal rigidity or meningeal signs  Cardiovascular: Normal rate, regular rhythm and normal heart sounds.   Pulmonary/Chest: Effort normal and breath sounds normal. No stridor. No respiratory distress. She has no decreased breath sounds. She has no wheezes. She has no rhonchi. She has no rales.  Abdominal: Soft. She exhibits no distension.  Musculoskeletal: Normal range of motion.  Neurological: She is alert and oriented to person, place, and time. She has normal strength.  Skin: Skin is warm and dry. She is not diaphoretic. No erythema.  Psychiatric: She has a normal mood and affect. Her behavior is normal.    ED Course  Procedures (including critical care time) Labs Review Labs Reviewed - No data to display  Imaging  Review No results found.   EKG Interpretation None      MDM   Final diagnoses:  Allergic rhinitis    Patient is an otherwise healthy 33 year old female who presents today with shortness of breath and nasal congestion since January. Heart rate normal, no pleuritic pain. Lungs clear to auscultation. Oxygen 96% on RA. Swollen nasal turbinates bilaterally. Encouraged daily use of Nasacort, Claritin. Given rx for albuterol inhaler as she has been using her son's inhaler. Follow up with PCP. Return instructions given. Vital signs stable for discharge. Patient / Family / Caregiver informed of clinical course, understand medical decision-making process, and agree with plan.  Elwyn Lade, PA-C 01/27/14 1057

## 2014-08-19 ENCOUNTER — Other Ambulatory Visit: Payer: Self-pay | Admitting: Nurse Practitioner

## 2014-08-19 DIAGNOSIS — N644 Mastodynia: Secondary | ICD-10-CM

## 2014-09-08 ENCOUNTER — Ambulatory Visit
Admission: RE | Admit: 2014-09-08 | Discharge: 2014-09-08 | Disposition: A | Discharge status: Home / Self Care | Payer: Medicaid Other | Source: Ambulatory Visit | Attending: Nurse Practitioner | Admitting: Nurse Practitioner

## 2014-09-08 DIAGNOSIS — N644 Mastodynia: Secondary | ICD-10-CM

## 2014-11-20 ENCOUNTER — Encounter (HOSPITAL_COMMUNITY): Payer: Self-pay | Admitting: Emergency Medicine

## 2014-11-20 ENCOUNTER — Emergency Department (HOSPITAL_COMMUNITY)
Admission: EM | Admit: 2014-11-20 | Discharge: 2014-11-20 | Disposition: A | Discharge status: Home / Self Care | Payer: Medicaid Other | Source: Home / Self Care | Attending: Family Medicine | Admitting: Family Medicine

## 2014-11-20 DIAGNOSIS — S86811A Strain of other muscle(s) and tendon(s) at lower leg level, right leg, initial encounter: Secondary | ICD-10-CM

## 2014-11-20 DIAGNOSIS — S86911A Strain of unspecified muscle(s) and tendon(s) at lower leg level, right leg, initial encounter: Secondary | ICD-10-CM

## 2014-11-20 MED ORDER — NAPROXEN 500 MG PO TABS: 500.0000 mg | ORAL_TABLET | Freq: Two times a day (BID) | ORAL | Status: DC

## 2014-11-20 NOTE — ED Provider Notes (Signed)
CSN: [id number]     Arrival date & time 11/20/14  1908 History   First MD Initiated Contact with Patient 11/20/14 2027     Chief Complaint  Patient presents with  . Knee Pain   (Consider location/radiation/quality/duration/timing/severity/associated sxs/prior Treatment) Patient is a 34 y.o. female presenting with knee pain. The history is provided by the patient.  Knee Pain Location:  Knee Injury: yes   Mechanism of injury comment:  Doing st leg ext exercises at gym on machine 1.5 wks ago. still sore. Knee location:  R knee   History reviewed. No pertinent past medical history. Past Surgical History  Procedure Laterality Date  . Tonsillectomy     Family History  Problem Relation Age of Onset  . Cancer Mother   . Hypertension Mother    History  Substance Use Topics  . Smoking status: Current Every Day Smoker  . Smokeless tobacco: Never Used  . Alcohol Use: Yes   OB History    No data available     Review of Systems  Constitutional: Negative.   Musculoskeletal: Positive for gait problem. Negative for myalgias and joint swelling.  Skin: Negative.     Allergies  Review of patient's allergies indicates no known allergies.  Home Medications   Prior to Admission medications   Medication Sig Start Date End Date Taking? Authorizing Provider  ibuprofen (ADVIL,MOTRIN) 800 MG tablet Take 800 mg by mouth every 8 (eight) hours as needed.   Yes Historical Provider, MD  albuterol (PROVENTIL HFA;VENTOLIN HFA) 108 (90 BASE) MCG/ACT inhaler Inhale 1 puff into the lungs every 6 (six) hours as needed for wheezing or shortness of breath.    Historical Provider, MD  albuterol (PROVENTIL HFA;VENTOLIN HFA) 108 (90 BASE) MCG/ACT inhaler Inhale 2 puffs into the lungs every 2 (two) hours as needed for wheezing or shortness of breath (cough). 01/27/14   Elwyn Lade, PA-C  loratadine (CLARITIN) 10 MG tablet Take 1 tablet (10 mg total) by mouth daily. One po daily x 5 days 01/27/14   Elwyn Lade, PA-C  naproxen (NAPROSYN) 500 MG tablet Take 1 tablet (500 mg total) by mouth 2 (two) times daily. 11/20/14   Billy Fischer, MD  triamcinolone (NASACORT AQ) 55 MCG/ACT AERO nasal inhaler Place 2 sprays into the nose daily. 01/27/14   Elwyn Lade, PA-C   BP 119/82 mmHg  Pulse 88  Temp(Src) 98.8 F (37.1 C) (Oral)  Resp 18  SpO2 98% Physical Exam  Constitutional: She is oriented to person, place, and time. She appears well-developed and well-nourished.  Musculoskeletal: She exhibits tenderness.       Right knee: She exhibits abnormal patellar mobility. She exhibits normal range of motion, no swelling, no effusion, no LCL laxity, no bony tenderness, normal meniscus and no MCL laxity. Tenderness found. MCL and patellar tendon tenderness noted. No medial joint line, no lateral joint line and no LCL tenderness noted.       Legs: Neurological: She is alert and oriented to person, place, and time.  Skin: Skin is warm and dry.  Nursing note and vitals reviewed.   ED Course  Procedures (including critical care time) Labs Review Labs Reviewed - No data to display  Imaging Review No results found.   MDM   1. Knee strain, right, initial encounter       Billy Fischer, MD 11/20/14 2040

## 2014-11-20 NOTE — Discharge Instructions (Signed)
Wear knee support daily, call your orthopedist for appt next week for recheck., activity as tolerated with knee support.

## 2014-11-20 NOTE — ED Notes (Signed)
Right knee pain.  Initially noticed pain while exercising 1-1 1/2 weeks ago.  Over the past 2 days pain has increased.

## 2015-02-24 ENCOUNTER — Encounter (HOSPITAL_COMMUNITY): Payer: Self-pay | Admitting: *Deleted

## 2015-02-24 ENCOUNTER — Emergency Department (HOSPITAL_COMMUNITY)
Admission: EM | Admit: 2015-02-24 | Discharge: 2015-02-24 | Disposition: A | Discharge status: Home / Self Care | Payer: Medicaid Other | Source: Home / Self Care | Attending: Family Medicine | Admitting: Family Medicine

## 2015-02-24 DIAGNOSIS — L309 Dermatitis, unspecified: Secondary | ICD-10-CM | POA: Diagnosis not present

## 2015-02-24 MED ORDER — FLUTICASONE PROPIONATE 0.005 % EX OINT: 1.0000 "application " | TOPICAL_OINTMENT | Freq: Two times a day (BID) | CUTANEOUS | Status: DC

## 2015-02-24 NOTE — ED Notes (Signed)
Pt  Reports  Symptoms  Of  Itchy  Hands     -  Dry  scaley  Areas    Present   -  Symptoms  For  About  1  Month         Pt  Reports          Symptoms  Not  releived  By  [facility]  meds

## 2015-02-24 NOTE — ED Provider Notes (Signed)
CSN: [id number]     Arrival date & time 02/24/15  36 History   First MD Initiated Contact with Patient 02/24/15 1615     Chief Complaint  Patient presents with  . Rash   (Consider location/radiation/quality/duration/timing/severity/associated sxs/prior Treatment) Patient is a 34 y.o. female presenting with rash. The history is provided by the patient.  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   History reviewed. No pertinent past medical history. Past Surgical History  Procedure Laterality Date  . Tonsillectomy     Family History  Problem Relation Age of Onset  . Cancer Mother   . Hypertension Mother    History  Substance Use Topics  . Smoking status: Current Every Day Smoker  . Smokeless tobacco: Never Used  . Alcohol Use: Yes   OB History    No data available     Review of Systems  Constitutional: Negative.   Skin: Positive for rash.    Allergies  Review of patient's allergies indicates no known allergies.  Home Medications   Prior to Admission medications   Medication Sig Start Date End Date Taking? Authorizing Provider  albuterol (PROVENTIL HFA;VENTOLIN HFA) 108 (90 BASE) MCG/ACT inhaler Inhale 1 puff into the lungs every 6 (six) hours as needed for wheezing or shortness of breath.    Historical Provider, MD  albuterol (PROVENTIL HFA;VENTOLIN HFA) 108 (90 BASE) MCG/ACT inhaler Inhale 2 puffs into the lungs every 2 (two) hours as needed for wheezing or shortness of breath (cough). 01/27/14   Cleatrice Burke, PA-C  fluticasone (CUTIVATE) 0.005 % ointment Apply 1 application topically 2 (two) times daily. 02/24/15   Billy Fischer, MD   ibuprofen (ADVIL,MOTRIN) 800 MG tablet Take 800 mg by mouth every 8 (eight) hours as needed.    Historical Provider, MD  loratadine (CLARITIN) 10 MG tablet Take 1 tablet (10 mg total) by mouth daily. One po daily x 5 days 01/27/14   Cleatrice Burke, PA-C  naproxen (NAPROSYN) 500 MG tablet Take 1 tablet (500 mg total) by mouth 2 (two) times daily. 11/20/14   Billy Fischer, MD  triamcinolone (NASACORT AQ) 55 MCG/ACT AERO nasal inhaler Place 2 sprays into the nose daily. 01/27/14   Cleatrice Burke, PA-C   BP 121/77 mmHg  Pulse 85  Temp(Src) 98.1 F (36.7 C) (Oral)  Resp 16  SpO2 98% Physical Exam  Constitutional: She is oriented to person, place, and time. She appears well-developed and well-nourished. No distress.  Neurological: She is alert and oriented to person, place, and time.  Skin: Skin is warm and dry. Rash noted.  Nursing note and vitals reviewed.   ED Course  Procedures (including critical care time) Labs Review Labs Reviewed - No data to display  Imaging Review No results found.   MDM   1. Eczema of both hands        Billy Fischer, MD 02/24/15 [phone]

## 2016-05-16 ENCOUNTER — Encounter: Payer: Self-pay | Admitting: Podiatry

## 2016-05-16 ENCOUNTER — Ambulatory Visit (INDEPENDENT_AMBULATORY_CARE_PROVIDER_SITE_OTHER): Payer: Medicaid Other

## 2016-05-16 ENCOUNTER — Ambulatory Visit (INDEPENDENT_AMBULATORY_CARE_PROVIDER_SITE_OTHER): Payer: Medicaid Other | Admitting: Podiatry

## 2016-05-16 ENCOUNTER — Ambulatory Visit: Payer: Self-pay

## 2016-05-16 VITALS — BP 126/77 | HR 68 | Resp 16 | Ht 65.0 in | Wt 245.0 lb

## 2016-05-16 DIAGNOSIS — M79671 Pain in right foot: Secondary | ICD-10-CM

## 2016-05-16 DIAGNOSIS — M79672 Pain in left foot: Secondary | ICD-10-CM

## 2016-05-16 DIAGNOSIS — M722 Plantar fascial fibromatosis: Secondary | ICD-10-CM | POA: Diagnosis not present

## 2016-05-16 MED ORDER — TRIAMCINOLONE ACETONIDE 10 MG/ML IJ SUSP
10.0000 mg | Freq: Once | INTRAMUSCULAR | Status: AC
  Administered 2016-05-16: 10 mg

## 2016-05-16 NOTE — Progress Notes (Signed)
Subjective:     Patient ID: [name], female   DOB: 21-Aug-1981, 35 y.o.   MRN: [id number]  HPI patient presents with pain in the bottom of both heels stating they get sore and make it hard for her to walk   Review of Systems  All other systems reviewed and are negative.      Objective:   Physical Exam  Constitutional: She is oriented to person, place, and time.  Cardiovascular: Intact distal pulses.   Musculoskeletal: Normal range of motion.  Neurological: She is oriented to person, place, and time.  Skin: Skin is warm.  Nursing note and vitals reviewed.  neurovascular status intact muscle strength adequate range of motion within normal limits with patient found to have discomfort plantar aspect both heels at the insertional point tendon into the calcaneus with inflammation and fluid around the medial band. Patient's found have good digital perfusion and is well oriented 3. I did not note equinus bilateral     Assessment:     Acute plantar fasciitis bilateral with inflammation and pain and moderate depression of the arch    Plan:     H&P conditions reviewed and injected the plantar fascia bilateral 3 mg Kenalog 5 mg Xylocaine. Instructed on physical therapy anti-inflammatories and went ahead and placed into fascial brace is at this time  X-ray report negative for signs of fracture or other bone pathology

## 2016-05-16 NOTE — Progress Notes (Signed)
   Subjective:    Patient ID: [name], female    DOB: Aug 31, 1981, 35 y.o.   MRN: [id number]  HPI Chief Complaint  Patient presents with  . Foot Pain    Bilateral; arch, lateral & heel; pt stated, "Has more pain in Left foot; on Left foot, top of foot hurts"; x3 months      Review of Systems  All other systems reviewed and are negative.      Objective:   Physical Exam        Assessment & Plan:

## 2016-05-16 NOTE — Patient Instructions (Signed)

## 2016-06-06 ENCOUNTER — Encounter: Payer: Self-pay | Admitting: Podiatry

## 2016-06-06 ENCOUNTER — Ambulatory Visit (INDEPENDENT_AMBULATORY_CARE_PROVIDER_SITE_OTHER): Payer: Medicaid Other | Admitting: Podiatry

## 2016-06-06 DIAGNOSIS — M722 Plantar fascial fibromatosis: Secondary | ICD-10-CM | POA: Diagnosis not present

## 2016-06-06 MED ORDER — TRIAMCINOLONE ACETONIDE 10 MG/ML IJ SUSP
10.0000 mg | Freq: Once | INTRAMUSCULAR | Status: AC
  Administered 2016-06-06: 10 mg

## 2016-06-06 NOTE — Patient Instructions (Signed)

## 2016-06-07 NOTE — Progress Notes (Signed)
Subjective:     Patient ID: [name], female   DOB: [birth year], 35 y.o.   MRN: [id number]  HPI patient presents stating I'm still getting pain in my left heel but I am improved and I did not do it [place] posted as far as stretching exercises and walking   Review of Systems     Objective:   Physical Exam Neurovascular status intact muscle strength adequate with inflammation still noted left plantar heel at the insertional point tendon calcaneus with the right when doing much better    Assessment:     Plantar fasciitis left present but improving    Plan:     Injected the left plantar fascia 3 Mill grams Kenalog 5 mill grams Xylocaine and instructed on anti-inflammatories physical therapy

## 2016-06-17 ENCOUNTER — Encounter: Payer: Self-pay | Admitting: *Deleted

## 2016-06-28 ENCOUNTER — Encounter: Payer: Self-pay | Admitting: Obstetrics & Gynecology

## 2016-06-28 ENCOUNTER — Ambulatory Visit (INDEPENDENT_AMBULATORY_CARE_PROVIDER_SITE_OTHER): Payer: Medicaid Other | Admitting: Obstetrics & Gynecology

## 2016-06-28 VITALS — BP 123/81 | HR 88 | Resp 18 | Ht 65.0 in | Wt 234.0 lb

## 2016-06-28 DIAGNOSIS — N852 Hypertrophy of uterus: Secondary | ICD-10-CM

## 2016-06-28 MED ORDER — IBUPROFEN 800 MG PO TABS: 800.0000 mg | ORAL_TABLET | Freq: Three times a day (TID) | ORAL | 2 refills | Status: DC | PRN

## 2016-06-28 NOTE — Progress Notes (Signed)
GYNECOLOGY VISIT NOTE  History:  35 y.o. [id number] here today for discussion of management of enlarged uterus noticed during examination at [facility].  She went there for IUD removal due to weight gain, depression and other side effects; it has been in place for 4 years and due for removal in March 2018.  During exam, her large uterus was palpated. Ultrasound showed myomatous uterus; IUD in place (see report below).  She was sent here for further management. She reports occasional discomfort, but no pain.  Had some spotting and red-brown discharge since the ultrasound, worried that ultrasound "shifted the IUD".  She denies any  other concerns.   Past Medical History:  Diagnosis Date  . Asthma     Past Surgical History:  Procedure Laterality Date  . TONSILLECTOMY      The following portions of the patient's history were reviewed and updated as appropriate: allergies, current medications, past family history, past medical history, past social history, past surgical history and problem list.   Health Maintenance:  Normal pap at [facility]  Review of Systems:  Pertinent items noted in HPI and remainder of comprehensive ROS otherwise negative.  Objective:  Physical Exam BP 123/81 (BP Location: Left Arm, Patient Position: Sitting, Cuff Size: Normal)   Pulse 88   Resp 18   Ht 5\' 5"  (1.651 m)   Wt 234 lb (106.1 kg)   BMI 38.94 kg/m  CONSTITUTIONAL: Well-developed, well-nourished female in no acute distress.  HENT:  Normocephalic, atraumatic. External right and left ear normal. Oropharynx is clear and moist EYES: Conjunctivae and EOM are normal. Pupils are equal, round, and reactive to light. No scleral icterus.  NECK: Normal range of motion, supple, no masses SKIN: Skin is warm and dry. No rash noted. Not diaphoretic. No erythema. No pallor. NEUROLOGIC: Alert and oriented to person, place, and time. Normal reflexes, muscle tone coordination. No cranial nerve deficit noted. PSYCHIATRIC: Normal  mood and affect. Normal behavior. Normal judgment and thought content. CARDIOVASCULAR: Normal heart rate noted RESPIRATORY: Effort and breath sounds normal, no problems with respiration noted ABDOMEN: Soft, large 22 week size globular uterus palpated, nontender   PELVIC: Normal appearing external genitalia; normal appearing vaginal mucosa and cervix.  IUD strings not visualized.  Brown-red discharge noted in vagina corresponding to spotting, no active bleeding noted.  22 week uterus palpated, unable to palpate adnexa. No uterine or adnexal tenderness. MUSCULOSKELETAL: Normal range of motion. No edema noted.  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 Endometrial thickness 74mm, IUD in place. Normal adnexa.   Assessment & Plan:  1. Enlarged uterus Given size of her uterus and inadequate pelvic ultrasound, pelvic MRI ordered for further characterization.  Discussed that this is possibly large fibroids, but malignancy can only be ruled out on surgical pathology.  Patient feels she is done with child-bearing, hysterectomy recommended. She is hesitant to do this now; open to doing this maybe in January 2018; she did not want me to proceed with scheduling it now.  Recommended Depo Lupron to help in reducing fibroid size and operative blood loss; especially given that we have at least 3 months in advance of the recommended surgery.  She wants to think about it, declines it today.  Will follow up MRI and manage accordingly.  No intervention currently needed for spotting, will continue to monitor.  IUD will remain in place for now, will be removed with uterus at  the time of hysterectomy.  Ibuprofen given prn pain; bleeding precautions advised. - MR Pelvis W Wo Contrast; Future  Please refer to After Visit Summary for other counseling recommendations.   Return if symptoms worsen or fail to improve.   Total  face-to-face time with patient: 20 minutes. Over 50% of encounter was spent on counseling and coordination of care.   Verita Schneiders, MD, [facility] [place], [facility] for [employer], [place]

## 2016-06-28 NOTE — Patient Instructions (Addendum)
Thank you for enrolling in [facility]. Please follow the instructions below to securely access your online medical record. MyChart allows you to send messages to your doctor, view your test results, manage appointments, and more.   How Do I Sign Up? 1. In your Internet browser, go to [employer] and enter [email or web address]. 2. Click on the Sign Up Now link in the Sign In box. You will see the New Member Sign Up page. 3. Enter your MyChart Access Code exactly as it appears below. You will not need to use this code after you've completed the sign-up process. If you do not sign up before the expiration date, you must request a new code.  MyChart Access Code: [email or web address] Expires: 07/15/2016 11:47 AM  4. Enter your Social Security Number ([id number]) and Date of Birth (mm/dd/yyyy) as indicated and click Submit. You will be taken to the next sign-up page. 5. Create a MyChart ID. This will be your MyChart login ID and cannot be changed, so think of one that is secure and easy to remember. 6. Create a MyChart password. You can change your password at any time. 7. Enter your Password Reset Question and Answer. This can be used at a later time if you forget your password.  8. Enter your e-mail address. You will receive e-mail notification when new information is available in [place]. 9. Click Sign Up. You can now view your medical record.   Additional Information Remember, MyChart is NOT to be used for urgent needs. For medical emergencies, dial 911.   Leuprolide depot injection What is this medicine? LEUPROLIDE (loo PROE lide) is a man-made protein that acts like a natural hormone in the body. It decreases testosterone in men and decreases estrogen in women. In men, this medicine is used to treat advanced prostate cancer. In women, some forms of this medicine may be used to treat endometriosis, uterine fibroids, or other female hormone-related problems. This medicine may be used  for other purposes; ask your health care provider or pharmacist if you have questions. What should I tell my health care provider before I take this medicine? They need to know if you have any of these conditions: -diabetes -heart disease or previous heart attack -high blood pressure -high cholesterol -osteoporosis -pain or difficulty passing urine -spinal cord metastasis -stroke -tobacco smoker -unusual vaginal bleeding (women) -an unusual or allergic reaction to leuprolide, benzyl alcohol, other medicines, foods, dyes, or preservatives -pregnant or trying to get pregnant -breast-feeding How should I use this medicine? This medicine is for injection into a muscle or for injection under the skin. It is given by a health care professional in a hospital or clinic setting. The specific product will determine how it will be given to you. Make sure you understand which product you receive and how often you will receive it. Talk to your pediatrician regarding the use of this medicine in children. Special care may be needed. Overdosage: If you think you have taken too much of this medicine contact a poison control center or emergency room at once. NOTE: This medicine is only for you. Do not share this medicine with others. What if I miss a dose? It is important not to miss a dose. Call your doctor or health care professional if you are unable to keep an appointment. Depot injections: Depot injections are given either once-monthly, every 12 weeks, every 16 weeks, or every 24 weeks depending on the product you are prescribed. The product you are prescribed will be based  on if you are female or female, and your condition. Make sure you understand your product and dosing. What may interact with this medicine? Do not take this medicine with any of the following medications: -chasteberry This medicine may also interact with the following medications: -herbal or dietary supplements, like black cohosh or  DHEA -female hormones, like estrogens or progestins and birth control pills, patches, rings, or injections -female hormones, like testosterone This list may not describe all possible interactions. Give your health care provider a list of all the medicines, herbs, non-prescription drugs, or dietary supplements you use. Also tell them if you smoke, drink alcohol, or use illegal drugs. Some items may interact with your medicine. What should I watch for while using this medicine? Visit your doctor or health care professional for regular checks on your progress. During the first weeks of treatment, your symptoms may get worse, but then will improve as you continue your treatment. You may get hot flashes, increased bone pain, increased difficulty passing urine, or an aggravation of nerve symptoms. Discuss these effects with your doctor or health care professional, some of them may improve with continued use of this medicine. Female patients may experience a menstrual cycle or spotting during the first months of therapy with this medicine. If this continues, contact your doctor or health care professional. What side effects may I notice from receiving this medicine? Side effects that you should report to your doctor or health care professional as soon as possible: -allergic reactions like skin rash, itching or hives, swelling of the face, lips, or tongue -breathing problems -chest pain -depression or memory disorders -pain in your legs or groin -pain at site where injected or implanted -severe headache -swelling of the feet and legs -visual changes -vomiting Side effects that usually do not require medical attention (report to your doctor or health care professional if they continue or are bothersome): -breast swelling or tenderness -decrease in sex drive or performance -diarrhea -hot flashes -loss of appetite -muscle, joint, or bone pains -nausea -redness or irritation at site where injected or  implanted -skin problems or acne This list may not describe all possible side effects. Call your doctor for medical advice about side effects. You may report side effects to FDA at 1-800-FDA-1088. Where should I keep my medicine? This drug is given in a hospital or clinic and will not be stored at home. NOTE: This sheet is a summary. It may not cover all possible information. If you have questions about this medicine, talk to your doctor, pharmacist, or health care provider.    2016, Elsevier/Gold Standard. (2014-06-06 14:16:23)   Abdominal Hysterectomy Abdominal hysterectomy is a surgical procedure to remove your womb (uterus). Your uterus is the muscular organ that contains a developing baby. This surgery is done for many reasons. You may need an abdominal hysterectomy if you have cancer, growths (tumors), long-term pain, or bleeding. You may also have this procedure if your uterus has slipped down into your vagina (uterine prolapse). Depending on why you need an abdominal hysterectomy, you may also have other reproductive organs removed. These could include the part of your vagina that connects with your uterus (cervix), the organs that make eggs (ovaries), and the tubes that connect the ovaries to the uterus (fallopian tubes). LET [facility] CARE PROVIDER KNOW ABOUT:   Any allergies you have.  All medicines you are taking, including vitamins, herbs, eye drops, creams, and over-the-counter medicines.  Previous problems you or members of your family have had  with the use of anesthetics.  Any blood disorders you have.  Previous surgeries you have had.  Medical conditions you have. RISKS AND COMPLICATIONS Generally, this is a safe procedure. However, as with any procedure, problems can occur. Infection is the most common problem after an abdominal hysterectomy. Other possible problems include:  Bleeding.  Formation of blood clots that may break free and travel to your  lungs.  Injury to other organs near your uterus.  Nerve injury causing nerve pain.  Decreased interest in sex or pain during sexual intercourse. BEFORE THE PROCEDURE  Abdominal hysterectomy is a major surgical procedure. It can affect the way you feel about yourself. Talk to your health care provider about the physical and emotional changes hysterectomy may cause.  You may need to have blood work and X-rays done before surgery.  Quit smoking if you smoke. Ask your health care provider for help if you are struggling to quit.  Stop taking medicines that thin your blood as directed by your health care provider.  You may be instructed to take antibiotic medicines or laxatives before surgery.  Do not eat or drink anything for 6-8 hours before surgery.  Take your regular medicines with a small sip of water.  Bathe or shower the night or morning before surgery. PROCEDURE  Abdominal hysterectomy is done in the operating room at the hospital.  In most cases, you will be given a medicine that makes you go to sleep (general anesthetic).  The surgeon will make a cut (incision) through the skin in your lower belly.  The incision may be about 5-7 inches long. It may go side-to-side or up-and-down.  The surgeon will move aside the body tissue that covers your uterus. The surgeon will then carefully take out your uterus along with any of your other reproductive organs that need to be removed.  Bleeding will be controlled with clamps or sutures.  The surgeon will close your incision with sutures or metal clips. AFTER THE PROCEDURE  You will have some pain immediately after the procedure.  You will be given pain medicine in the recovery room.  You will be taken to your hospital room when you have recovered from the anesthesia.  You may need to stay in the hospital for 2-5 days.  You will be given instructions for recovery at home.   This information is not intended to replace advice  given to you by your health care provider. Make sure you discuss any questions you have with your health care provider.   Document Released: 09/17/2013 Document Reviewed: 09/17/2013 Elsevier Interactive Patient Education 2016 Elsevier Inc.   Uterine Fibroids Uterine fibroids are tissue masses (tumors) that can develop in the womb (uterus). They are also called leiomyomas. This type of tumor is not cancerous (benign) and does not spread to other parts of the body outside of the pelvic area, which is between the hip bones. Occasionally, fibroids may develop in the fallopian tubes, in the cervix, or on the support structures (ligaments) that surround the uterus. You can have one or many fibroids. Fibroids can vary in size, weight, and where they grow in the uterus. Some can become quite large. Most fibroids do not require medical treatment. CAUSES A fibroid can develop when a single uterine cell keeps growing (replicating). Most cells in the human body have a control mechanism that keeps them from replicating without control. SIGNS AND SYMPTOMS Symptoms may include:   Heavy bleeding during your period.  Bleeding or spotting between  periods.  Pelvic pain and pressure.  Bladder problems, such as needing to urinate more often (urinary frequency) or urgently.  Inability to reproduce offspring (infertility).  Miscarriages. DIAGNOSIS Uterine fibroids are diagnosed through a physical exam. Your health care provider may feel the lumpy tumors during a pelvic exam. Ultrasonography and an MRI may be done to determine the size, location, and number of fibroids. TREATMENT Treatment may include:  Watchful waiting. This involves getting the fibroid checked by your health care provider to see if it grows or shrinks. Follow your health care provider's recommendations for how often to have this checked.  Hormone medicines. These can be taken by mouth or given through an intrauterine device  (IUD).  Surgery.  Removing the fibroids (myomectomy) or the uterus (hysterectomy).  Removing blood supply to the fibroids (uterine artery embolization). If fibroids interfere with your fertility and you want to become pregnant, your health care provider may recommend having the fibroids removed.  HOME CARE INSTRUCTIONS  Keep all follow-up visits as directed by your health care provider. This is important.  Take medicines only as directed by your health care provider.  If you were prescribed a hormone treatment, take the hormone medicines exactly as directed.  Do not take aspirin, because it can cause bleeding.  Ask your health care provider about taking iron pills and increasing the amount of dark green, leafy vegetables in your diet. These actions can help to boost your blood iron levels, which may be affected by heavy menstrual bleeding.  Pay close attention to your period and tell your health care provider about any changes, such as:  Increased blood flow that requires you to use more pads or tampons than usual per month.  A change in the number of days that your period lasts per month.  A change in symptoms that are associated with your period, such as abdominal cramping or back pain. SEEK MEDICAL CARE IF:  You have pelvic pain, back pain, or abdominal cramps that cannot be controlled with medicines.  You have an increase in bleeding between and during periods.  You soak tampons or pads in a half hour or less.  You feel lightheaded, extra tired, or weak. SEEK IMMEDIATE MEDICAL CARE IF:  You faint.  You have a sudden increase in pelvic pain.   This information is not intended to replace advice given to you by your health care provider. Make sure you discuss any questions you have with your health care provider.   Document Released: 09/09/2000 Document Revised: 10/03/2014 Document Reviewed: 03/11/2014 Elsevier Interactive Patient Education [employer].

## 2016-07-11 ENCOUNTER — Ambulatory Visit (HOSPITAL_COMMUNITY)
Admission: RE | Admit: 2016-07-11 | Discharge: 2016-07-11 | Disposition: A | Discharge status: Home / Self Care | Payer: Medicaid Other | Source: Ambulatory Visit | Attending: Obstetrics & Gynecology | Admitting: Obstetrics & Gynecology

## 2016-07-11 DIAGNOSIS — D259 Leiomyoma of uterus, unspecified: Secondary | ICD-10-CM | POA: Insufficient documentation

## 2016-07-11 DIAGNOSIS — N852 Hypertrophy of uterus: Secondary | ICD-10-CM | POA: Diagnosis present

## 2016-07-11 MED ORDER — GADOBENATE DIMEGLUMINE 529 MG/ML IV SOLN
20.0000 mL | Freq: Once | INTRAVENOUS | Status: AC | PRN
  Administered 2016-07-11: 20 mL via INTRAVENOUS

## 2016-07-13 ENCOUNTER — Telehealth: Payer: Self-pay | Admitting: General Practice

## 2016-07-13 NOTE — Telephone Encounter (Signed)
Per Dr Harolyn Rutherford, MRI shows multiple large fibroids. Called patient & informed her of results. Patient verbalized understanding and asked what now. Told patient it looks like her and Dr Harolyn Rutherford already discussed a couple options so it is really up to her at this point. Patient verbalized understanding & states she will call back if she decides on surgery. patient had no questions

## 2016-08-15 ENCOUNTER — Ambulatory Visit (INDEPENDENT_AMBULATORY_CARE_PROVIDER_SITE_OTHER): Payer: Medicaid Other | Admitting: Obstetrics & Gynecology

## 2016-08-15 ENCOUNTER — Encounter (HOSPITAL_COMMUNITY): Payer: Self-pay | Admitting: *Deleted

## 2016-08-15 ENCOUNTER — Encounter: Payer: Self-pay | Admitting: Obstetrics & Gynecology

## 2016-08-15 VITALS — BP 122/83 | HR 84 | Ht 65.0 in | Wt 236.0 lb

## 2016-08-15 DIAGNOSIS — D250 Submucous leiomyoma of uterus: Secondary | ICD-10-CM

## 2016-08-15 DIAGNOSIS — D251 Intramural leiomyoma of uterus: Secondary | ICD-10-CM | POA: Diagnosis not present

## 2016-08-15 LAB — CBC WITH DIFFERENTIAL/PLATELET
Basophils Absolute: 0 cells/uL (ref 0–200)
Basophils Relative: 0 %
Eosinophils Absolute: 200 cells/uL (ref 15–500)
Eosinophils Relative: 2 %
HEMATOCRIT: 43.9 % (ref 35.0–45.0)
HEMOGLOBIN: 14.5 g/dL (ref 11.7–15.5)
LYMPHS ABS: 4000 {cells}/uL — AB (ref 850–3900)
Lymphocytes Relative: 40 %
MCH: 30.5 pg (ref 27.0–33.0)
MCHC: 33 g/dL (ref 32.0–36.0)
MCV: 92.4 fL (ref 80.0–100.0)
MONO ABS: 600 {cells}/uL (ref 200–950)
MPV: 9.1 fL (ref 7.5–12.5)
Monocytes Relative: 6 %
NEUTROS PCT: 52 %
Neutro Abs: 5200 cells/uL (ref 1500–7800)
Platelets: 324 10*3/uL (ref 140–400)
RBC: 4.75 MIL/uL (ref 3.80–5.10)
RDW: 12.6 % (ref 11.0–15.0)
WBC: 10 10*3/uL (ref 3.8–10.8)

## 2016-08-15 LAB — BASIC METABOLIC PANEL
BUN: 13 mg/dL (ref 7–25)
CO2: 25 mmol/L (ref 20–31)
Calcium: 9.4 mg/dL (ref 8.6–10.2)
Chloride: 103 mmol/L (ref 98–110)
Creat: 0.87 mg/dL (ref 0.50–1.10)
Glucose, Bld: 87 mg/dL (ref 65–99)
Potassium: 4.3 mmol/L (ref 3.5–5.3)
Sodium: 139 mmol/L (ref 135–146)

## 2016-08-15 NOTE — Progress Notes (Signed)
Spotting for 1 week at the end of September and October. She would like second opinion on ultrasound results.

## 2016-08-15 NOTE — Patient Instructions (Signed)
Abdominal Hysterectomy Abdominal hysterectomy is a surgical procedure to remove your womb (uterus). Your uterus is the muscular organ that contains a developing baby. This surgery is done for many reasons. You may need an abdominal hysterectomy if you have cancer, growths (tumors), long-term pain, or bleeding. You may also have this procedure if your uterus has slipped down into your vagina (uterine prolapse). Depending on why you need an abdominal hysterectomy, you may also have other reproductive organs removed. These could include the part of your vagina that connects with your uterus (cervix), the organs that make eggs (ovaries), and the tubes that connect the ovaries to the uterus (fallopian tubes). Tell a health care provider about:  Any allergies you have.  All medicines you are taking, including vitamins, herbs, eye drops, creams, and over-the-counter medicines.  Any problems you or family members have had with anesthetic medicines.  Any blood disorders you have.  Any surgeries you have had.  Any medical conditions you have. What are the risks? Generally, this is a safe procedure. However, as with any procedure, problems can occur. Infection is the most common problem after an abdominal hysterectomy. Other possible problems include:  Bleeding.  Formation of blood clots that may break free and travel to your lungs.  Injury to other organs near your uterus.  Nerve injury causing nerve pain.  Decreased interest in sex or pain during sexual intercourse. What happens before the procedure?  Abdominal hysterectomy is a major surgical procedure. It can affect the way you feel about yourself. Talk to your health care provider about the physical and emotional changes hysterectomy may cause.  You may need to have blood work and X-rays done before surgery.  Quit smoking if you smoke. Ask your health care provider for help if you are struggling to quit.  Stop taking medicines that thin  your blood as directed by your health care provider.  You may be instructed to take antibiotic medicines or laxatives before surgery.  Do not eat or drink anything for 6-8 hours before surgery.  Take your regular medicines with a small sip of water.  Bathe or shower the night or morning before surgery. What happens during the procedure?  Abdominal hysterectomy is done in the operating room at the hospital.  In most cases, you will be given a medicine that makes you go to sleep (general anesthetic).  The surgeon will make a cut (incision) through the skin in your lower belly.  The incision may be about 5-7 inches long. It may go side-to-side or up-and-down.  The surgeon will move aside the body tissue that covers your uterus. The surgeon will then carefully take out your uterus along with any of your other reproductive organs that need to be removed.  Bleeding will be controlled with clamps or sutures.  The surgeon will close your incision with sutures or metal clips. What happens after the procedure?  You will have some pain immediately after the procedure.  You will be given pain medicine in the recovery room.  You will be taken to your hospital room when you have recovered from the anesthesia.  You may need to stay in the hospital for 2-5 days.  You will be given instructions for recovery at home. This information is not intended to replace advice given to you by your health care provider. Make sure you discuss any questions you have with your health care provider. Document Released: 09/17/2013 Document Revised: 02/18/2016 Document Reviewed: 07/05/2013 Elsevier Interactive Patient Education  2017  [employer] Myomectomy Myomectomy is surgery to remove a noncancerous tumor (myoma) from the uterus. Myomas are tumors made up of fibrous tissue. They are often called fibroid tumors. Fibroid tumors can range from the size of a pea to the size of a grapefruit. In a myomectomy, the  fibroid tumor is removed without removing the uterus. Because these tumors are rarely cancerous, this surgery is usually done only if the tumor is growing or causing symptoms such as pain, pressure, bleeding, or pain with intercourse. LET [facility] CARE PROVIDER KNOW ABOUT:  Any allergies you have.  All medicines you are taking, including vitamins, herbs, eye drops, creams, and over-the-counter medicines.  Previous problems you or members of your family have had with the use of anesthetics.  Any blood disorders you have.  Previous surgeries you have had.  Medical conditions you have. RISKS AND COMPLICATIONS  Generally, this is a safe procedure. However, as with any procedure, complications can occur. Possible complications include:  Excessive bleeding.  Infection.  Injury to nearby organs.  Blood clots in the legs, chest, or brain.  Scar tissue on other organs and in the pelvis. This may require another surgery to remove the scar tissue. BEFORE THE PROCEDURE  Ask your health care provider about changing or stopping your regular medicines. Avoid taking aspirin or blood thinners as directed by your health care provider.  Do not  eat or drink anything after midnight on the night before surgery.  If you smoke, do not  smoke for 2 weeks before the surgery.  Do not  drink alcohol the day before the surgery.  Arrange for someone to drive you home after the procedure or after your hospital stay. Also arrange for someone to help you with activities during your recovery. PROCEDURE You will be given medicine to make you sleep through the procedure (general anesthetic). Any of the following methods may be used to perform a myomectomy:  Small monitors will be put on your body. They are used to check your heart, blood pressure, and oxygen level.  An IV access tube will be put into one of your veins. Medicine will be able to flow directly into your body through this IV tube.  You might  be given a medicine to help you relax (sedative).  You will be given a medicine to make you sleep (general anesthetic). A breathing tube will be placed into your lungs during the procedure.  A thin, flexible tube (catheter) will be inserted into your bladder to collect urine.  Any of the following methods may be used to perform a myomectomy:  Hysteroscopic myomectomy-This method may be used when the fibroid tumor is inside the cavity of the uterus. A long, thin tube that is like a telescope (hysteroscope) is inserted inside the uterus. A saline solution is put into your uterus. This expands the uterus and allows the surgeon to see the fibroids. Tools are passed through the hysteroscope to remove the fibroid tumor in pieces.  Laparoscopic myomectomy-A few small cuts (incisions) are made in the lower abdomen. A thin, lighted tube with a tiny camera on the end (laparoscope) is inserted through one of the incisions. This gives the surgeon a good view of the area. The fibroid tumor is removed through the other incisions. The incisions are then closed with stitches (sutures) or staples.  Abdominal myomectomy-This method is used when the fibroid tumor cannot be removed with a hysteroscope or laparoscope. The surgery is performed through a larger surgical incision in  the abdomen. The fibroid tumor is removed through this incision. The incision is closed with sutures or staples. AFTER THE PROCEDURE  If you had a laparoscopic or hysteroscopic myomectomy, you may be able to go home the same day, or you may need to stay in the hospital overnight.  If you had an abdominal myomectomy, you may need to stay in the hospital for a few days.  Your IV access tube and catheter will be removed in 1-2 days.  You may be given medicine for pain or to help you sleep.  You may be given an antibiotic medicine, if needed. This information is not intended to replace advice given to you by your health care provider. Make  sure you discuss any questions you have with your health care provider. Document Released: 07/10/2007 Document Revised: 07/03/2013 Document Reviewed: 04/24/2013 Elsevier Interactive Patient Education  2017 [place]. Myomectomy, Care After Refer to this sheet in the next few weeks. These instructions provide you with information on caring for yourself after your procedure. Your health care provider may also give you more specific instructions. Your treatment has been planned according to current medical practices, but problems sometimes occur. Call your health care provider if you have any problems or questions after your procedure. WHAT TO EXPECT AFTER THE PROCEDURE After your procedure, it is typical to have the following:  Pain in your abdomen, especially at any incision sites. You will be given pain medicine to control the pain.  Tiredness. This is a normal part of the recovery process. Your energy level will return to normal over the next several weeks.  Constipation.  Vaginal bleeding. This is normal and should stop after 1-2 weeks. HOME CARE INSTRUCTIONS   Only take over-the-counter or prescription medicines as directed by your health care provider. Avoid aspirin because it can cause bleeding.  Do not douche, use tampons, or have sexual intercourse until given permission by your health care provider.  Remove or change any bandages (dressings) as directed by your health care provider.  Take showers instead of baths as directed by your health care provider.  You will probably be able to go back to your normal routine after a few days. Do not do anything that requires extra effort until your health care provider says it is okay. Do not lift anything heavier than 15 pounds (6.8 kg) until your health care provider approves.  Walk daily but take frequent rest breaks if you tire easily.  Continue to practice deep breathing and coughing. If it hurts to cough, try holding a pillow  against your belly as you cough.  If you become constipated, you may:  Use a mild laxative if your health care provider approves.  Add more fruit and bran to your diet.  Drink enough fluids to keep your urine clear or pale yellow.  Take your temperature twice a day and write it down.  Do not drink alcohol.  Do not drive until your health care provider approves.  Have someone help you at home for 1 week or until you can do your own household activities.  Follow up with your health care provider as directed. SEEK MEDICAL CARE IF:  You have a fever.  You have increasing abdominal pain that is not relieved with medicine.  You have nausea, vomiting, or diarrhea.  You have pain when you urinate, or you have blood in your urine.  You have a rash on your body.  You have pain or redness where your IV access  tube was inserted.  You have redness, swelling, or any kind of drainage from an incision. SEEK IMMEDIATE MEDICAL CARE IF:   You have weakness or lightheadedness.  You have pain, swelling, or redness in your legs.  You have chest pain.  You faint.  You have shortness of breath.  You have heavy vaginal bleeding.  Your incision is opening up. This information is not intended to replace advice given to you by your health care provider. Make sure you discuss any questions you have with your health care provider. Document Released: 02/02/2011 Document Revised: 10/03/2014 Document Reviewed: 04/24/2013 Elsevier Interactive Patient Education  2017 [employer].

## 2016-08-15 NOTE — Progress Notes (Signed)
History:  35 y.o. [name] here today for eval of uterine fibroids.   Pt is s/p SVD x 2. Pt has an IUD so only rarely has cycles.  The bleeding is very light when it occurs. Pt does reports discharge.  Pt denies odor otc. Pt reports discomfort with bending over. Pt feel that her abd is enlarged. She thought it was from gaining weight.  Pt reports that she is done with childbearing.  She is worried about not having the option to conceive.    Pt with a family h/o breast cancer at age cancer. She has a h/o uterine fibroids.    The following portions of the patient's history were reviewed and updated as appropriate: allergies, current medications, past family history, past medical history, past social history, past surgical history and problem list.  Review of Systems:  Pertinent items are noted in HPI.   Objective:  Physical Exam Blood pressure 122/83, pulse 84, height 5\' 5"  (1.651 m), weight 236 lb (107 kg). Gen: NAD Lungs: CTA CV: RRR  Abd: Soft, nontender and nondistended; mass effect noted to 3 cm  Pelvic: Normal appearing external genitalia; normal appearing vaginal mucosa and cervix.  Uterus: enlarged 23 week sized plus.  Mobile but uterus has a wide base. Does not appear to be amenable to a RATH.    Labs and Imaging 07/11/2016 CLINICAL DATA:  Pelvic mass.  Fibroids.  EXAM: MRI PELVIS WITHOUT AND WITH CONTRAST  TECHNIQUE: Multiplanar multisequence MR imaging of the pelvis was performed both before and after administration of intravenous contrast.  CONTRAST:  42mL MULTIHANCE GADOBENATE DIMEGLUMINE 529 MG/ML IV SOLN  COMPARISON:  None.  FINDINGS: Urinary Tract: Unremarkable urinary bladder.  Bowel: Unremarkable pelvic bowel loops.  Vascular/Lymphatic: No pathologically enlarged lymph nodes or other significant abnormality.  Reproductive:  Uterus: Measures 20.5 x 9.7 x 16.4 cm. Estimated volume = 1696 mL. Numerous fibroids are seen involving the uterus diffusely  which are submucosal, intramural, and subserosal in location. Single largest intramural fibroid is located in the right lateral lower uterine corpus, measuring 10.2 cm in maximum diameter. No pedunculated or intracavitary fibroids identified. All fibroids show diffuse contrast enhancement.  No evidence of abnormal endometrial thickening. Cervix and vagina are unremarkable in appearance.  Right ovary: Appears normal.  No mass identified.  Left ovary:  Appears normal.  No mass identified.  Other: No abnormal free fluid.  Musculoskeletal:  Unremarkable.  IMPRESSION: Markedly enlarged uterus with diffuse involvement by numerous fibroids.  Normal appearance of both ovaries.  No adnexal masses identified.   Assessment & Plan:  Fibroid uterus- symptomatic due to the size and mass effect.   Pt wants operative management. She is leaning towards a myomectomy. She wants to discuss a hyst with her family. She wants this scheduled at the first of the year.  Patient desires surgical management with myomectomy vs hysterectomy for sx uterine fibroids.  The risks of surgery were discussed in detail with the patient including but not limited to: bleeding which may require transfusion or reoperation; infection which may require prolonged hospitalization or re-hospitalization and antibiotic therapy; injury to bowel, bladder, ureters and major vessels or other surrounding organs; need for additional procedures including laparotomy; thromboembolic phenomenon, incisional problems and other postoperative or anesthesia complications.  Patient was told that the likelihood that her condition and symptoms will be treated effectively with this surgical management was very high; the postoperative expectations were also discussed in detail. The patient also understands the alternative treatment options which were discussed  in full. All questions were answered.  She want to have the myomectomy scheduled but,  will discuss with her faily.  May call back to change the procedure. She will f/u in 6 weeks PRIOR to the surgery to review the pre and post op recommendations.  Total face-to-face time with patient was 30 min.  Greater than 50% was spent in counseling and coordination of care with the patient.   [name], M.D., [name]

## 2016-08-16 ENCOUNTER — Encounter (HOSPITAL_COMMUNITY): Payer: Self-pay

## 2016-09-22 ENCOUNTER — Telehealth: Payer: Self-pay | Admitting: Obstetrics & Gynecology

## 2016-09-22 NOTE — Telephone Encounter (Signed)
TC to pt to discuss myomectomy vs hysterectomy. On my last visit with pt she was undecided. She reports that she wants to proceed with the myomectomy. She says that she is not actively trying to get pregnant but, wants to keep that option open. I have reviewed with her the risks of each including the risk that the fibroids will return and that she will need more surgery in the future. She is aware and wishes to proceed with a myomectomy.  Pt asked about her IUD which expired in [facility].  She will have it removed and replaced after her 6 week post op check. I have explained to the pt that she should not conceive for 1months to 1 year to allow for complete recovery prior to pregnancy.  She expresses understanding and reports that she does not have a partner at present.   [name], M.D., [name]

## 2016-09-27 NOTE — Patient Instructions (Addendum)
Your procedure is scheduled on:  Tuesday, Jan. 9, 2018  Enter through the [employer] [facility] at:  10:00 AM  Pick up the phone at the desk and dial [phone].  Call this number if you have problems the morning of surgery: [phone].  Remember: Do NOT eat food or drink after:  Midnight Monday  Take these medicines the morning of surgery with a SIP OF WATER:  None  Stop ALL herbal medications, Ibuprofen, Omega 3, Turmeric at this time   Do NOT wear jewelry (body piercing), metal hair clips/bobby pins, make-up, or nail polish. Do NOT wear lotions, powders, or perfumes.  You may wear deodorant. Do NOT shave for 48 hours prior to surgery. Do NOT bring valuables to the hospital. Contacts, dentures, or bridgework may not be worn into surgery.  Leave suitcase in car.  After surgery it may be brought to your room.  For patients admitted to the hospital, checkout time is 11:00 AM the day of discharge.  Have a responsible adult drive you home and stay with you for 24 hours after your procedure

## 2016-09-28 ENCOUNTER — Encounter (HOSPITAL_COMMUNITY)
Admission: RE | Admit: 2016-09-28 | Discharge: 2016-09-28 | Disposition: A | Discharge status: Home / Self Care | Payer: Medicaid Other | Source: Ambulatory Visit | Attending: Obstetrics & Gynecology | Admitting: Obstetrics & Gynecology

## 2016-09-28 ENCOUNTER — Ambulatory Visit (INDEPENDENT_AMBULATORY_CARE_PROVIDER_SITE_OTHER): Payer: Medicaid Other | Admitting: Obstetrics & Gynecology

## 2016-09-28 ENCOUNTER — Encounter (HOSPITAL_COMMUNITY): Payer: Self-pay

## 2016-09-28 ENCOUNTER — Encounter: Payer: Self-pay | Admitting: Obstetrics & Gynecology

## 2016-09-28 VITALS — BP 124/76 | HR 78 | Ht 65.0 in | Wt 239.0 lb

## 2016-09-28 DIAGNOSIS — D251 Intramural leiomyoma of uterus: Secondary | ICD-10-CM | POA: Diagnosis not present

## 2016-09-28 DIAGNOSIS — D252 Subserosal leiomyoma of uterus: Principal | ICD-10-CM

## 2016-09-28 DIAGNOSIS — Z01812 Encounter for preprocedural laboratory examination: Secondary | ICD-10-CM | POA: Insufficient documentation

## 2016-09-28 DIAGNOSIS — Z01818 Encounter for other preprocedural examination: Secondary | ICD-10-CM

## 2016-09-28 DIAGNOSIS — D250 Submucous leiomyoma of uterus: Secondary | ICD-10-CM

## 2016-09-28 HISTORY — DX: Carpal tunnel syndrome, unspecified upper limb: G56.00

## 2016-09-28 LAB — CBC
HCT: 43.2 % (ref 36.0–46.0)
HEMOGLOBIN: 14.7 g/dL (ref 12.0–15.0)
MCH: 30.4 pg (ref 26.0–34.0)
MCHC: 34 g/dL (ref 30.0–36.0)
MCV: 89.4 fL (ref 78.0–100.0)
Platelets: 312 10*3/uL (ref 150–400)
RBC: 4.83 MIL/uL (ref 3.87–5.11)
RDW: 12.1 % (ref 11.5–15.5)
WBC: 11 10*3/uL — AB (ref 4.0–10.5)

## 2016-09-28 NOTE — Progress Notes (Signed)
Preoperative History and Physical  [name] is a 36 y.o. [id number] here for surgical management of uterine fibroids   Proposed surgery: abdominal myomectomy  Past Medical History:  Diagnosis Date  . Asthma   . Carpal tunnel syndrome   . Vaginal Pap smear, abnormal    Past Surgical History:  Procedure Laterality Date  . COLPOSCOPY    . TONSILLECTOMY     OB History    Gravida Para Term Preterm AB Living   2 2 2     2    SAB TAB Ectopic Multiple Live Births           2     Patient denies any cervical dysplasia or STIs.  (Not in a hospital admission)  No Known Allergies Social History:   reports that she has been smoking E-cigarettes.  She has never used smokeless tobacco. She reports that she drinks alcohol. She reports that she does not use drugs. Family History  Problem Relation Age of Onset  . Hypertension Mother   . Cancer Mother 61    breast    Review of Systems: Noncontributory  PHYSICAL EXAM: Blood pressure 124/76, pulse 78, height 5\' 5"  (1.651 m), weight 239 lb (108.4 kg). General appearance - alert, well appearing, and in no distress Chest - clear to auscultation, no wheezes, rales or rhonchi, symmetric air entry Heart - normal rate and regular rhythm Abdomen - soft, nontender, nondistended, no masses or organomegaly; mass effect noted from fibroids Pelvic - examination not indicated Extremities - peripheral pulses normal, no pedal edema, no clubbing or cyanosis  Labs: Results for orders placed or performed during the hospital encounter of 09/28/16 (from the past 336 hour(s))  CBC   Collection Time: 09/28/16 12:05 PM  Result Value Ref Range   WBC 11.0 (H) 4.0 - 10.5 K/uL   RBC 4.83 3.87 - 5.11 MIL/uL   Hemoglobin 14.7 12.0 - 15.0 g/dL   HCT 43.2 36.0 - 46.0 %   MCV 89.4 78.0 - 100.0 fL   MCH 30.4 26.0 - 34.0 pg   MCHC 34.0 30.0 - 36.0 g/dL   RDW 12.1 11.5 - 15.5 %   Platelets 312 150 - 400 K/uL  Type and screen [place]   Collection Time: 09/28/16 12:05 PM  Result Value Ref Range   ABO/RH(D) B POS    Antibody Screen PENDING    Sample Expiration 10/12/2016     Imaging Studies: No results found.  Assessment: Patient Active Problem List   Diagnosis Date Noted  . Enlarged uterus 06/28/2016    Plan: Patient will undergo surgical management with abdominal myomectomy.   The risks of surgery were discussed in detail with the patient including but not limited to: bleeding which may require transfusion or reoperation; infection which may require antibiotics; injury to surrounding organs which may involve bowel, bladder, ureters ; need for additional procedures including laparoscopy or laparotomy; thromboembolic phenomenon, surgical site problems and other postoperative/anesthesia complications. Likelihood of success in alleviating the patient's condition was discussed. Routine postoperative instructions will be reviewed with the patient and her family in detail after surgery.  The patient concurred with the proposed plan, giving informed written consent for the surgery.  Pt should be NPO for 8 hours prior to procedure.   Pt to f/u in 1 week for procedure. Tobias Alexander Harraway-Smith, M.D., [place] 09/28/2016 1:17 PM

## 2016-09-28 NOTE — Patient Instructions (Addendum)
Uterine Fibroids Uterine fibroids are tissue masses (tumors) that can develop in the womb (uterus). They are also called leiomyomas. This type of tumor is not cancerous (benign) and does not spread to other parts of the body outside of the pelvic area, which is between the hip bones. Occasionally, fibroids may develop in the fallopian tubes, in the cervix, or on the support structures (ligaments) that surround the uterus. You can have one or many fibroids. Fibroids can vary in size, weight, and where they grow in the uterus. Some can become quite large. Most fibroids do not require medical treatment. What are the causes? A fibroid can develop when a single uterine cell keeps growing (replicating). Most cells in the human body have a control mechanism that keeps them from replicating without control. What are the signs or symptoms? Symptoms may include:  Heavy bleeding during your period.  Bleeding or spotting between periods.  Pelvic pain and pressure.  Bladder problems, such as needing to urinate more often (urinary frequency) or urgently.  Inability to reproduce offspring (infertility).  Miscarriages. How is this diagnosed? Uterine fibroids are diagnosed through a physical exam. Your health care provider may feel the lumpy tumors during a pelvic exam. Ultrasonography and an MRI may be done to determine the size, location, and number of fibroids. How is this treated? Treatment may include:  Watchful waiting. This involves getting the fibroid checked by your health care provider to see if it grows or shrinks. Follow your health care provider's recommendations for how often to have this checked.  Hormone medicines. These can be taken by mouth or given through an intrauterine device (IUD).  Surgery.  Removing the fibroids (myomectomy) or the uterus (hysterectomy).  Removing blood supply to the fibroids (uterine artery embolization). If fibroids interfere with your fertility and you  want to become pregnant, your health care provider may recommend having the fibroids removed. Follow these instructions at home:  Keep all follow-up visits as directed by your health care provider. This is important.  Take over-the-counter and prescription medicines only as told by your health care provider.  If you were prescribed a hormone treatment, take the hormone medicines exactly as directed.  Ask your health care provider about taking iron pills and increasing the amount of dark green, leafy vegetables in your diet. These actions can help to boost your blood iron levels, which may be affected by heavy menstrual bleeding.  Pay close attention to your period and tell your health care provider about any changes, such as:  Increased blood flow that requires you to use more pads or tampons than usual per month.  A change in the number of days that your period lasts per month.  A change in symptoms that are associated with your period, such as abdominal cramping or back pain. Contact a health care provider if:  You have pelvic pain, back pain, or abdominal cramps that cannot be controlled with medicines.  You have an increase in bleeding between and during periods.  You soak tampons or pads in a half hour or less.  You feel lightheaded, extra tired, or weak. Get help right away if:  You faint.  You have a sudden increase in pelvic pain. This information is not intended to replace advice given to you by your health care provider. Make sure you discuss any questions you have with your health care provider. Document Released: 09/09/2000 Document Revised: 05/12/2016 Document Reviewed: 03/11/2014 Elsevier Interactive Patient Education  2017 [place]. Myomectomy, Care After  Refer to this sheet in the next few weeks. These instructions provide you with information on caring for yourself after your procedure. Your health care provider may also give you more specific instructions.  Your treatment has been planned according to current medical practices, but problems sometimes occur. Call your health care provider if you have any problems or questions after your procedure. WHAT TO EXPECT AFTER THE PROCEDURE After your procedure, it is typical to have the following:  Pain in your abdomen, especially at any incision sites. You will be given pain medicine to control the pain.  Tiredness. This is a normal part of the recovery process. Your energy level will return to normal over the next several weeks.  Constipation.  Vaginal bleeding. This is normal and should stop after 1-2 weeks. HOME CARE INSTRUCTIONS   Only take over-the-counter or prescription medicines as directed by your health care provider. Avoid aspirin because it can cause bleeding.  Do not douche, use tampons, or have sexual intercourse until given permission by your health care provider.  Remove or change any bandages (dressings) as directed by your health care provider.  Take showers instead of baths as directed by your health care provider.  You will probably be able to go back to your normal routine after a few days. Do not do anything that requires extra effort until your health care provider says it is okay. Do not lift anything heavier than 15 pounds (6.8 kg) until your health care provider approves.  Walk daily but take frequent rest breaks if you tire easily.  Continue to practice deep breathing and coughing. If it hurts to cough, try holding a pillow against your belly as you cough.  If you become constipated, you may:  Use a mild laxative if your health care provider approves.  Add more fruit and bran to your diet.  Drink enough fluids to keep your urine clear or pale yellow.  Take your temperature twice a day and write it down.  Do not drink alcohol.  Do not drive until your health care provider approves.  Have someone help you at home for 1 week or until you can do your own household  activities.  Follow up with your health care provider as directed. SEEK MEDICAL CARE IF:  You have a fever.  You have increasing abdominal pain that is not relieved with medicine.  You have nausea, vomiting, or diarrhea.  You have pain when you urinate, or you have blood in your urine.  You have a rash on your body.  You have pain or redness where your IV access tube was inserted.  You have redness, swelling, or any kind of drainage from an incision. SEEK IMMEDIATE MEDICAL CARE IF:   You have weakness or lightheadedness.  You have pain, swelling, or redness in your legs.  You have chest pain.  You faint.  You have shortness of breath.  You have heavy vaginal bleeding.  Your incision is opening up. This information is not intended to replace advice given to you by your health care provider. Make sure you discuss any questions you have with your health care provider. Document Released: 02/02/2011 Document Revised: 10/03/2014 Document Reviewed: 04/24/2013 Elsevier Interactive Patient Education  2017 [place].  Myomectomy Myomectomy is surgery to remove a noncancerous tumor (myoma) from the uterus. Myomas are tumors made up of fibrous tissue. They are often called fibroid tumors. Fibroid tumors can range from the size of a pea to the size of a grapefruit.  In a myomectomy, the fibroid tumor is removed without removing the uterus. Because these tumors are rarely cancerous, this surgery is usually done only if the tumor is growing or causing symptoms such as pain, pressure, bleeding, or pain with intercourse. LET [facility] CARE PROVIDER KNOW ABOUT:  Any allergies you have.  All medicines you are taking, including vitamins, herbs, eye drops, creams, and over-the-counter medicines.  Previous problems you or members of your family have had with the use of anesthetics.  Any blood disorders you have.  Previous surgeries you have had.  Medical conditions you have. RISKS  AND COMPLICATIONS  Generally, this is a safe procedure. However, as with any procedure, complications can occur. Possible complications include:  Excessive bleeding.  Infection.  Injury to nearby organs.  Blood clots in the legs, chest, or brain.  Scar tissue on other organs and in the pelvis. This may require another surgery to remove the scar tissue. BEFORE THE PROCEDURE  Ask your health care provider about changing or stopping your regular medicines. Avoid taking aspirin or blood thinners as directed by your health care provider.  Do not  eat or drink anything after midnight on the night before surgery.  If you smoke, do not  smoke for 2 weeks before the surgery.  Do not  drink alcohol the day before the surgery.  Arrange for someone to drive you home after the procedure or after your hospital stay. Also arrange for someone to help you with activities during your recovery. PROCEDURE You will be given medicine to make you sleep through the procedure (general anesthetic). Any of the following methods may be used to perform a myomectomy:  Small monitors will be put on your body. They are used to check your heart, blood pressure, and oxygen level.  An IV access tube will be put into one of your veins. Medicine will be able to flow directly into your body through this IV tube.  You might be given a medicine to help you relax (sedative).  You will be given a medicine to make you sleep (general anesthetic). A breathing tube will be placed into your lungs during the procedure.  A thin, flexible tube (catheter) will be inserted into your bladder to collect urine.  Any of the following methods may be used to perform a myomectomy:  Hysteroscopic myomectomy-This method may be used when the fibroid tumor is inside the cavity of the uterus. A long, thin tube that is like a telescope (hysteroscope) is inserted inside the uterus. A saline solution is put into your uterus. This expands the  uterus and allows the surgeon to see the fibroids. Tools are passed through the hysteroscope to remove the fibroid tumor in pieces.  Laparoscopic myomectomy-A few small cuts (incisions) are made in the lower abdomen. A thin, lighted tube with a tiny camera on the end (laparoscope) is inserted through one of the incisions. This gives the surgeon a good view of the area. The fibroid tumor is removed through the other incisions. The incisions are then closed with stitches (sutures) or staples.  Abdominal myomectomy-This method is used when the fibroid tumor cannot be removed with a hysteroscope or laparoscope. The surgery is performed through a larger surgical incision in the abdomen. The fibroid tumor is removed through this incision. The incision is closed with sutures or staples. AFTER THE PROCEDURE  If you had a laparoscopic or hysteroscopic myomectomy, you may be able to go home the same day, or you may need to  stay in the hospital overnight.  If you had an abdominal myomectomy, you may need to stay in the hospital for a few days.  Your IV access tube and catheter will be removed in 1-2 days.  You may be given medicine for pain or to help you sleep.  You may be given an antibiotic medicine, if needed. This information is not intended to replace advice given to you by your health care provider. Make sure you discuss any questions you have with your health care provider. Document Released: 07/10/2007 Document Revised: 07/03/2013 Document Reviewed: 04/24/2013 Elsevier Interactive Patient Education  2017 [employer].

## 2016-10-04 ENCOUNTER — Ambulatory Visit (HOSPITAL_COMMUNITY): Payer: Medicaid Other | Admitting: Anesthesiology

## 2016-10-04 ENCOUNTER — Encounter (HOSPITAL_COMMUNITY): Payer: Self-pay | Admitting: Anesthesiology

## 2016-10-04 ENCOUNTER — Inpatient Hospital Stay (HOSPITAL_COMMUNITY)
Admission: RE | Admit: 2016-10-04 | Discharge: 2016-10-07 | DRG: 742 | Disposition: A | Discharge status: Home / Self Care | Payer: Medicaid Other | Source: Ambulatory Visit | Attending: Obstetrics & Gynecology | Admitting: Obstetrics & Gynecology

## 2016-10-04 ENCOUNTER — Encounter (HOSPITAL_COMMUNITY)
Admission: RE | Disposition: A | Discharge status: Home / Self Care | Payer: Self-pay | Source: Ambulatory Visit | Attending: Obstetrics & Gynecology

## 2016-10-04 DIAGNOSIS — D250 Submucous leiomyoma of uterus: Secondary | ICD-10-CM

## 2016-10-04 DIAGNOSIS — D500 Iron deficiency anemia secondary to blood loss (chronic): Secondary | ICD-10-CM | POA: Diagnosis not present

## 2016-10-04 DIAGNOSIS — Z9889 Other specified postprocedural states: Secondary | ICD-10-CM

## 2016-10-04 DIAGNOSIS — Z6839 Body mass index (BMI) 39.0-39.9, adult: Secondary | ICD-10-CM

## 2016-10-04 DIAGNOSIS — D259 Leiomyoma of uterus, unspecified: Principal | ICD-10-CM

## 2016-10-04 DIAGNOSIS — D251 Intramural leiomyoma of uterus: Secondary | ICD-10-CM

## 2016-10-04 DIAGNOSIS — D62 Acute posthemorrhagic anemia: Secondary | ICD-10-CM | POA: Diagnosis not present

## 2016-10-04 DIAGNOSIS — D252 Subserosal leiomyoma of uterus: Secondary | ICD-10-CM

## 2016-10-04 HISTORY — PX: MYOMECTOMY: SHX85

## 2016-10-04 LAB — CBC
HCT: 29.1 % — ABNORMAL LOW (ref 36.0–46.0)
HEMATOCRIT: 32.9 % — AB (ref 36.0–46.0)
HEMOGLOBIN: 10 g/dL — AB (ref 12.0–15.0)
Hemoglobin: 11.1 g/dL — ABNORMAL LOW (ref 12.0–15.0)
MCH: 29.8 pg (ref 26.0–34.0)
MCH: 29.9 pg (ref 26.0–34.0)
MCHC: 33.7 g/dL (ref 30.0–36.0)
MCHC: 34.4 g/dL (ref 30.0–36.0)
MCV: 88.2 fL (ref 78.0–100.0)
MCV: 86.9 fL (ref 78.0–100.0)
PLATELETS: 186 10*3/uL (ref 150–400)
Platelets: 176 10*3/uL (ref 150–400)
RBC: 3.73 MIL/uL — ABNORMAL LOW (ref 3.87–5.11)
RBC: 3.35 MIL/uL — ABNORMAL LOW (ref 3.87–5.11)
RDW: 14.7 % (ref 11.5–15.5)
RDW: 15 % (ref 11.5–15.5)
WBC: 27.9 10*3/uL — AB (ref 4.0–10.5)
WBC: 21.7 10*3/uL — ABNORMAL HIGH (ref 4.0–10.5)

## 2016-10-04 LAB — PREPARE RBC (CROSSMATCH)

## 2016-10-04 LAB — PREGNANCY, URINE: PREG TEST UR: NEGATIVE

## 2016-10-04 SURGERY — MYOMECTOMY, ABDOMINAL APPROACH
Anesthesia: General | Site: Abdomen

## 2016-10-04 MED ORDER — LACTATED RINGERS IV SOLN: INTRAVENOUS | Status: DC

## 2016-10-04 MED ORDER — HYDROMORPHONE 1 MG/ML IV SOLN
INTRAVENOUS | Status: DC
  Administered 2016-10-04: 1.5 mg via INTRAVENOUS
  Administered 2016-10-04: 16:00:00 via INTRAVENOUS
  Administered 2016-10-04 – 2016-10-05 (×2): 0.8 mg via INTRAVENOUS
  Filled 2016-10-04: qty 25

## 2016-10-04 MED ORDER — FENTANYL CITRATE (PF) 250 MCG/5ML IJ SOLN
INTRAMUSCULAR | Status: AC
  Filled 2016-10-04: qty 5

## 2016-10-04 MED ORDER — PHENYLEPHRINE HCL 10 MG/ML IJ SOLN
INTRAMUSCULAR | Status: DC | PRN
  Administered 2016-10-04 (×2): 120 ug via INTRAVENOUS
  Administered 2016-10-04 (×2): 80 ug via INTRAVENOUS
  Administered 2016-10-04: 120 ug via INTRAVENOUS
  Administered 2016-10-04: 80 ug via INTRAVENOUS

## 2016-10-04 MED ORDER — ALBUMIN HUMAN 5 % IV SOLN
INTRAVENOUS | Status: DC | PRN
  Administered 2016-10-04: 10:00:00 via INTRAVENOUS

## 2016-10-04 MED ORDER — FENTANYL CITRATE (PF) 100 MCG/2ML IJ SOLN
INTRAMUSCULAR | Status: AC
  Administered 2016-10-04: 25 ug via INTRAVENOUS
  Filled 2016-10-04: qty 2

## 2016-10-04 MED ORDER — ALBUMIN HUMAN 5 % IV SOLN
25.0000 g | Freq: Once | INTRAVENOUS | Status: AC
Start: 2016-10-04 — End: 2016-10-04
  Administered 2016-10-04: 25 g via INTRAVENOUS
  Filled 2016-10-04: qty 500

## 2016-10-04 MED ORDER — FENTANYL CITRATE (PF) 100 MCG/2ML IJ SOLN
25.0000 ug | INTRAMUSCULAR | Status: DC | PRN
  Administered 2016-10-04 (×4): 25 ug via INTRAVENOUS

## 2016-10-04 MED ORDER — LIDOCAINE HCL (CARDIAC) 20 MG/ML IV SOLN
INTRAVENOUS | Status: AC
  Filled 2016-10-04: qty 5

## 2016-10-04 MED ORDER — ONDANSETRON HCL 4 MG PO TABS: 4.0000 mg | ORAL_TABLET | Freq: Four times a day (QID) | ORAL | Status: DC | PRN

## 2016-10-04 MED ORDER — MEPERIDINE HCL 25 MG/ML IJ SOLN: 6.2500 mg | INTRAMUSCULAR | Status: DC | PRN

## 2016-10-04 MED ORDER — FENTANYL CITRATE (PF) 100 MCG/2ML IJ SOLN
INTRAMUSCULAR | Status: DC | PRN
Start: 2016-10-04 — End: 2016-10-04
  Administered 2016-10-04 (×2): 100 ug via INTRAVENOUS
  Administered 2016-10-04 (×2): 50 ug via INTRAVENOUS
  Administered 2016-10-04: 100 ug via INTRAVENOUS
  Administered 2016-10-04: 50 ug via INTRAVENOUS

## 2016-10-04 MED ORDER — SODIUM CHLORIDE 0.9 % IV SOLN
INTRAVENOUS | Status: DC | PRN
  Administered 2016-10-04: 10:00:00 via INTRAVENOUS

## 2016-10-04 MED ORDER — DIPHENHYDRAMINE HCL 50 MG/ML IJ SOLN
INTRAMUSCULAR | Status: DC | PRN
  Administered 2016-10-04: 12.5 mg via INTRAVENOUS

## 2016-10-04 MED ORDER — ONDANSETRON HCL 4 MG/2ML IJ SOLN: 4.0000 mg | Freq: Four times a day (QID) | INTRAMUSCULAR | Status: DC | PRN

## 2016-10-04 MED ORDER — IBUPROFEN 600 MG PO TABS
600.0000 mg | ORAL_TABLET | Freq: Four times a day (QID) | ORAL | Status: DC | PRN
  Administered 2016-10-04: 600 mg via ORAL
  Filled 2016-10-04: qty 1

## 2016-10-04 MED ORDER — SCOPOLAMINE 1 MG/3DAYS TD PT72
MEDICATED_PATCH | TRANSDERMAL | Status: AC
Start: 2016-10-04 — End: 2016-10-04
  Administered 2016-10-04: 1.5 mg
  Filled 2016-10-04: qty 1

## 2016-10-04 MED ORDER — MAGNESIUM CITRATE PO SOLN
1.0000 | Freq: Once | ORAL | Status: DC | PRN
  Filled 2016-10-04: qty 296

## 2016-10-04 MED ORDER — BUPIVACAINE HCL (PF) 0.5 % IJ SOLN
INTRAMUSCULAR | Status: DC | PRN
  Administered 2016-10-04: 37 mL

## 2016-10-04 MED ORDER — DIPHENHYDRAMINE HCL 50 MG/ML IJ SOLN
INTRAMUSCULAR | Status: AC
  Filled 2016-10-04: qty 1

## 2016-10-04 MED ORDER — METOCLOPRAMIDE HCL 5 MG/ML IJ SOLN: 10.0000 mg | Freq: Once | INTRAMUSCULAR | Status: DC | PRN

## 2016-10-04 MED ORDER — PROPOFOL 10 MG/ML IV BOLUS
INTRAVENOUS | Status: AC
  Filled 2016-10-04: qty 20

## 2016-10-04 MED ORDER — SUGAMMADEX SODIUM 500 MG/5ML IV SOLN
INTRAVENOUS | Status: DC | PRN
  Administered 2016-10-04: 433.6 mg via INTRAVENOUS

## 2016-10-04 MED ORDER — NALOXONE HCL 0.4 MG/ML IJ SOLN: 0.4000 mg | INTRAMUSCULAR | Status: DC | PRN

## 2016-10-04 MED ORDER — MIDAZOLAM HCL 2 MG/2ML IJ SOLN
INTRAMUSCULAR | Status: DC | PRN
  Administered 2016-10-04: 1 mg via INTRAVENOUS

## 2016-10-04 MED ORDER — ROCURONIUM BROMIDE 100 MG/10ML IV SOLN
INTRAVENOUS | Status: DC | PRN
  Administered 2016-10-04: 20 mg via INTRAVENOUS
  Administered 2016-10-04: 50 mg via INTRAVENOUS
  Administered 2016-10-04: 10 mg via INTRAVENOUS
  Administered 2016-10-04: 20 mg via INTRAVENOUS

## 2016-10-04 MED ORDER — SODIUM CHLORIDE 0.9% FLUSH: 9.0000 mL | INTRAVENOUS | Status: DC | PRN

## 2016-10-04 MED ORDER — EPHEDRINE SULFATE 50 MG/ML IJ SOLN
INTRAMUSCULAR | Status: DC | PRN
  Administered 2016-10-04 (×3): 10 mg via INTRAVENOUS
  Administered 2016-10-04: 20 mg via INTRAVENOUS
  Administered 2016-10-04: 15 mg via INTRAVENOUS
  Administered 2016-10-04: 10 mg via INTRAVENOUS
  Administered 2016-10-04: 15 mg via INTRAVENOUS
  Administered 2016-10-04: 10 mg via INTRAVENOUS

## 2016-10-04 MED ORDER — MIDAZOLAM HCL 2 MG/2ML IJ SOLN
INTRAMUSCULAR | Status: AC
  Filled 2016-10-04: qty 2

## 2016-10-04 MED ORDER — KETOROLAC TROMETHAMINE 30 MG/ML IJ SOLN: 30.0000 mg | Freq: Four times a day (QID) | INTRAMUSCULAR | Status: DC

## 2016-10-04 MED ORDER — KETOROLAC TROMETHAMINE 30 MG/ML IJ SOLN
30.0000 mg | Freq: Four times a day (QID) | INTRAMUSCULAR | Status: DC
  Administered 2016-10-04 – 2016-10-05 (×3): 30 mg via INTRAVENOUS
  Filled 2016-10-04 (×3): qty 1

## 2016-10-04 MED ORDER — DEXAMETHASONE SODIUM PHOSPHATE 10 MG/ML IJ SOLN
INTRAMUSCULAR | Status: DC | PRN
  Administered 2016-10-04: 4 mg via INTRAVENOUS

## 2016-10-04 MED ORDER — MENTHOL 3 MG MT LOZG: 1.0000 | LOZENGE | OROMUCOSAL | Status: DC | PRN

## 2016-10-04 MED ORDER — BUPIVACAINE HCL (PF) 0.5 % IJ SOLN
INTRAMUSCULAR | Status: AC
  Filled 2016-10-04: qty 30

## 2016-10-04 MED ORDER — PHENYLEPHRINE 40 MCG/ML (10ML) SYRINGE FOR IV PUSH (FOR BLOOD PRESSURE SUPPORT)
PREFILLED_SYRINGE | INTRAVENOUS | Status: AC
  Filled 2016-10-04: qty 10

## 2016-10-04 MED ORDER — ONDANSETRON HCL 4 MG/2ML IJ SOLN
INTRAMUSCULAR | Status: AC
  Filled 2016-10-04: qty 2

## 2016-10-04 MED ORDER — DEXAMETHASONE SODIUM PHOSPHATE 4 MG/ML IJ SOLN
INTRAMUSCULAR | Status: AC
  Filled 2016-10-04: qty 1

## 2016-10-04 MED ORDER — OXYCODONE-ACETAMINOPHEN 5-325 MG PO TABS
1.0000 | ORAL_TABLET | ORAL | Status: DC | PRN
  Administered 2016-10-04: 2 via ORAL
  Administered 2016-10-05 (×3): 1 via ORAL
  Administered 2016-10-06 – 2016-10-07 (×4): 2 via ORAL
  Filled 2016-10-04: qty 1
  Filled 2016-10-04: qty 2
  Filled 2016-10-04: qty 1
  Filled 2016-10-04: qty 2
  Filled 2016-10-04: qty 1
  Filled 2016-10-04 (×3): qty 2

## 2016-10-04 MED ORDER — DIPHENHYDRAMINE HCL 12.5 MG/5ML PO ELIX: 12.5000 mg | ORAL_SOLUTION | Freq: Four times a day (QID) | ORAL | Status: DC | PRN

## 2016-10-04 MED ORDER — DEXTROSE-NACL 5-0.45 % IV SOLN
INTRAVENOUS | Status: DC
  Administered 2016-10-04: 16:00:00 via INTRAVENOUS
  Administered 2016-10-05: 125 mL/h via INTRAVENOUS
  Administered 2016-10-05: 09:00:00 via INTRAVENOUS

## 2016-10-04 MED ORDER — DIPHENHYDRAMINE HCL 50 MG/ML IJ SOLN: 12.5000 mg | Freq: Four times a day (QID) | INTRAMUSCULAR | Status: DC | PRN

## 2016-10-04 MED ORDER — KETOROLAC TROMETHAMINE 30 MG/ML IJ SOLN: 30.0000 mg | Freq: Once | INTRAMUSCULAR | Status: DC

## 2016-10-04 MED ORDER — LACTATED RINGERS IV SOLN
INTRAVENOUS | Status: DC
  Administered 2016-10-04 (×4): via INTRAVENOUS

## 2016-10-04 MED ORDER — PROPOFOL 10 MG/ML IV BOLUS
INTRAVENOUS | Status: DC | PRN
  Administered 2016-10-04: 200 mg via INTRAVENOUS

## 2016-10-04 MED ORDER — PANTOPRAZOLE SODIUM 40 MG PO TBEC
40.0000 mg | DELAYED_RELEASE_TABLET | Freq: Every day | ORAL | Status: DC
  Administered 2016-10-05 – 2016-10-07 (×3): 40 mg via ORAL
  Filled 2016-10-04 (×3): qty 1

## 2016-10-04 MED ORDER — POLYETHYLENE GLYCOL 3350 17 G PO PACK: 17.0000 g | PACK | Freq: Every day | ORAL | Status: DC | PRN

## 2016-10-04 MED ORDER — VASOPRESSIN 20 UNIT/ML IV SOLN
INTRAVENOUS | Status: DC | PRN
  Administered 2016-10-04: 15 mL via INTRAMUSCULAR
  Administered 2016-10-04: 6 mL via INTRAMUSCULAR

## 2016-10-04 MED ORDER — EPHEDRINE 5 MG/ML INJ
INTRAVENOUS | Status: AC
  Filled 2016-10-04: qty 10

## 2016-10-04 MED ORDER — ONDANSETRON HCL 4 MG/2ML IJ SOLN
INTRAMUSCULAR | Status: DC | PRN
  Administered 2016-10-04: 4 mg via INTRAVENOUS

## 2016-10-04 MED ORDER — SUGAMMADEX SODIUM 200 MG/2ML IV SOLN
INTRAVENOUS | Status: AC
  Filled 2016-10-04: qty 2

## 2016-10-04 MED ORDER — CEFAZOLIN SODIUM-DEXTROSE 2-4 GM/100ML-% IV SOLN
2.0000 g | INTRAVENOUS | Status: AC
  Administered 2016-10-04: 2 g via INTRAVENOUS

## 2016-10-04 MED ORDER — KETOROLAC TROMETHAMINE 30 MG/ML IJ SOLN
INTRAMUSCULAR | Status: AC
  Filled 2016-10-04: qty 1

## 2016-10-04 MED ORDER — LIDOCAINE HCL (CARDIAC) 20 MG/ML IV SOLN
INTRAVENOUS | Status: DC | PRN
  Administered 2016-10-04: 30 mg via INTRAVENOUS

## 2016-10-04 MED ORDER — SIMETHICONE 80 MG PO CHEW: 80.0000 mg | CHEWABLE_TABLET | Freq: Four times a day (QID) | ORAL | Status: DC | PRN

## 2016-10-04 MED ORDER — DOCUSATE SODIUM 100 MG PO CAPS
100.0000 mg | ORAL_CAPSULE | Freq: Two times a day (BID) | ORAL | Status: DC
  Administered 2016-10-04 – 2016-10-07 (×6): 100 mg via ORAL
  Filled 2016-10-04 (×6): qty 1

## 2016-10-04 MED ORDER — BISACODYL 10 MG RE SUPP: 10.0000 mg | Freq: Every day | RECTAL | Status: DC | PRN

## 2016-10-04 MED ORDER — SCOPOLAMINE 1 MG/3DAYS TD PT72: 1.0000 | MEDICATED_PATCH | Freq: Once | TRANSDERMAL | Status: DC

## 2016-10-04 MED ORDER — ROCURONIUM BROMIDE 100 MG/10ML IV SOLN
INTRAVENOUS | Status: AC
  Filled 2016-10-04: qty 1

## 2016-10-04 MED ORDER — VASOPRESSIN 20 UNIT/ML IV SOLN
INTRAVENOUS | Status: AC
  Filled 2016-10-04: qty 1

## 2016-10-04 SURGICAL SUPPLY — 53 items
APL SKNCLS STERI-STRIP NONHPOA (GAUZE/BANDAGES/DRESSINGS) ×1
BAG DECANTER FOR FLEXI CONT (MISCELLANEOUS) ×2 IMPLANT
BARRIER ADHS 3X4 INTERCEED (GAUZE/BANDAGES/DRESSINGS) IMPLANT
BENZOIN TINCTURE PRP APPL 2/3 (GAUZE/BANDAGES/DRESSINGS) ×2 IMPLANT
BRR ADH 4X3 ABS CNTRL BYND (GAUZE/BANDAGES/DRESSINGS)
CANISTER SUCT 3000ML (MISCELLANEOUS) ×3 IMPLANT
CELLS DAT CNTRL 66122 CELL SVR (MISCELLANEOUS) IMPLANT
CLOSURE WOUND 1/2 X4 (GAUZE/BANDAGES/DRESSINGS) ×1
CLOTH BEACON ORANGE TIMEOUT ST (SAFETY) ×3 IMPLANT
CONT PATH 16OZ SNAP LID 3702 (MISCELLANEOUS) ×3 IMPLANT
DECANTER SPIKE VIAL GLASS SM (MISCELLANEOUS) ×2 IMPLANT
DRAIN PENROSE 1/4X12 LTX (DRAIN) ×2 IMPLANT
DRAPE CESAREAN BIRTH W POUCH (DRAPES) ×2 IMPLANT
DRSG OPSITE POSTOP 4X10 (GAUZE/BANDAGES/DRESSINGS) ×2 IMPLANT
DURAPREP 26ML APPLICATOR (WOUND CARE) ×3 IMPLANT
GAUZE SPONGE 4X4 12PLY STRL (GAUZE/BANDAGES/DRESSINGS) ×3 IMPLANT
GAUZE SPONGE 4X4 16PLY XRAY LF (GAUZE/BANDAGES/DRESSINGS) ×3 IMPLANT
GLOVE BIO SURGEON STRL SZ7 (GLOVE) ×3 IMPLANT
GLOVE BIOGEL PI IND STRL 7.0 (GLOVE) ×2 IMPLANT
GLOVE BIOGEL PI INDICATOR 7.0 (GLOVE) ×4
GOWN STRL REUS W/TWL LRG LVL3 (GOWN DISPOSABLE) ×6 IMPLANT
GOWN STRL REUS W/TWL XL LVL3 (GOWN DISPOSABLE) ×3 IMPLANT
NDL SPNL 22GX3.5 QUINCKE BK (NEEDLE) ×1 IMPLANT
NEEDLE HYPO 22GX1.5 SAFETY (NEEDLE) ×5 IMPLANT
NEEDLE SPNL 22GX3.5 QUINCKE BK (NEEDLE) ×6 IMPLANT
NS IRRIG 1000ML POUR BTL (IV SOLUTION) ×3 IMPLANT
PACK ABDOMINAL GYN (CUSTOM PROCEDURE TRAY) ×3 IMPLANT
PAD ABD 7.5X8 STRL (GAUZE/BANDAGES/DRESSINGS) ×6 IMPLANT
PAD ABD 8X7 1/2 STERILE (GAUZE/BANDAGES/DRESSINGS) ×2 IMPLANT
PAD OB MATERNITY 4.3X12.25 (PERSONAL CARE ITEMS) ×3 IMPLANT
PENCIL SMOKE EVAC W/HOLSTER (ELECTROSURGICAL) ×3 IMPLANT
PROTECTOR NERVE ULNAR (MISCELLANEOUS) ×3 IMPLANT
RETRACTOR WND ALEXIS 18 MED (MISCELLANEOUS) IMPLANT
RETRACTOR WND ALEXIS 25 LRG (MISCELLANEOUS) IMPLANT
RTRCTR WOUND ALEXIS 18CM MED (MISCELLANEOUS)
RTRCTR WOUND ALEXIS 25CM LRG (MISCELLANEOUS)
SPONGE LAP 18X18 X RAY DECT (DISPOSABLE) ×14 IMPLANT
STAPLER VISISTAT 35W (STAPLE) ×3 IMPLANT
STRIP CLOSURE SKIN 1/2X4 (GAUZE/BANDAGES/DRESSINGS) ×1 IMPLANT
SUT VIC AB 0 CT1 18XCR BRD8 (SUTURE) IMPLANT
SUT VIC AB 0 CT1 27 (SUTURE) ×51
SUT VIC AB 0 CT1 27XBRD ANBCTR (SUTURE) ×6 IMPLANT
SUT VIC AB 0 CT1 8-18 (SUTURE) ×3
SUT VIC AB 3-0 CT1 27 (SUTURE)
SUT VIC AB 3-0 CT1 TAPERPNT 27 (SUTURE) IMPLANT
SUT VIC AB 3-0 SH 27 (SUTURE) ×6
SUT VIC AB 3-0 SH 27X BRD (SUTURE) IMPLANT
SUT VIC AB 4-0 KS 27 (SUTURE) ×3 IMPLANT
SYR CONTROL 10ML LL (SYRINGE) ×5 IMPLANT
TAPE HYPAFIX 4 X10 (GAUZE/BANDAGES/DRESSINGS) ×2 IMPLANT
TOWEL OR 17X24 6PK STRL BLUE (TOWEL DISPOSABLE) ×6 IMPLANT
TRAY FOLEY CATH SILVER 14FR (SET/KITS/TRAYS/PACK) ×3 IMPLANT
WATER STERILE IRR 1000ML POUR (IV SOLUTION) ×3 IMPLANT

## 2016-10-04 NOTE — Anesthesia Preprocedure Evaluation (Signed)
Anesthesia Evaluation  Patient identified by MRN, date of birth, ID band Patient awake    Reviewed: Allergy & Precautions, NPO status , Patient's Chart, lab work & pertinent test results  Airway Mallampati: II  TM Distance: >3 FB Neck ROM: Full    Dental no notable dental hx.    Pulmonary asthma , Current Smoker (vape),    Pulmonary exam normal breath sounds clear to auscultation       Cardiovascular negative cardio ROS Normal cardiovascular exam Rhythm:Regular Rate:Normal     Neuro/Psych negative neurological ROS  negative psych ROS   GI/Hepatic negative GI ROS, Neg liver ROS,   Endo/Other  Morbid obesity  Renal/GU negative Renal ROS  negative genitourinary   Musculoskeletal negative musculoskeletal ROS (+)   Abdominal   Peds negative pediatric ROS (+)  Hematology negative hematology ROS (+)   Anesthesia Other Findings   Reproductive/Obstetrics negative OB ROS                             Anesthesia Physical Anesthesia Plan  ASA: III  Anesthesia Plan: General   Post-op Pain Management:    Induction: Intravenous  Airway Management Planned: Oral ETT  Additional Equipment:   Intra-op Plan:   Post-operative Plan: Extubation in OR  Informed Consent: I have reviewed the patients History and Physical, chart, labs and discussed the procedure including the risks, benefits and alternatives for the proposed anesthesia with the patient or authorized representative who has indicated his/her understanding and acceptance.   Dental advisory given  Plan Discussed with: CRNA  Anesthesia Plan Comments:         Anesthesia Quick Evaluation

## 2016-10-04 NOTE — Transfer of Care (Signed)
Immediate Anesthesia Transfer of Care Note  Patient: [name]  Procedure(s) Performed: Procedure(s): MYOMECTOMY (N/A)  Patient Location: PACU  Anesthesia Type:General  Level of Consciousness: awake, alert , oriented and patient cooperative  Airway & Oxygen Therapy: Patient Spontanous Breathing and Patient connected to nasal cannula oxygen  Post-op Assessment: Report given to RN and Post -op Vital signs reviewed and stable  Post vital signs: Reviewed and stable  Last Vitals:  Vitals:   10/04/16 0728  BP: (!) 124/108  Pulse: 84  Resp: 16  Temp: 36.7 C    Last Pain:  Vitals:   10/04/16 0728  TempSrc: Oral      Patients Stated Pain Goal: 2 ([id number])  Complications: No apparent anesthesia complications

## 2016-10-04 NOTE — Anesthesia Postprocedure Evaluation (Signed)
Anesthesia Post Note  Patient: [name]  Procedure(s) Performed: Procedure(s) (LRB): MYOMECTOMY (N/A)  Patient location during evaluation: PACU Anesthesia Type: General Level of consciousness: awake Pain management: pain level controlled Vital Signs Assessment: post-procedure vital signs reviewed and stable Respiratory status: spontaneous breathing Cardiovascular status: tachycardic Postop Assessment: no signs of nausea or vomiting Anesthetic complications: no        Last Vitals:  Vitals:   10/04/16 1500 10/04/16 1515  BP: 103/74 111/71  Pulse: (!) 114 (!) 117  Resp: 16 16  Temp: 36.5 C     Last Pain:  Vitals:   10/04/16 1515  TempSrc:   PainSc: 3    Pain Goal: Patients Stated Pain Goal: 2 (10/04/16 1500)               Levina Boyack JR,JOHN Fatimata Talsma

## 2016-10-04 NOTE — Brief Op Note (Signed)
10/04/2016  12:03 PM  PATIENT:  [name]  36 y.o. female  PRE-OPERATIVE DIAGNOSIS:  cpt [phone] - Symptomatic uterine fibroids  POST-OPERATIVE DIAGNOSIS:  symptomatic uterine fibroids; acute blood loss anemia  PROCEDURE:  Procedure(s): MYOMECTOMY (N/A)  SURGEON:  Surgeon(s) and Role:    * Lavonia Drafts, MD - Primary    * Donnamae Jude, MD - Assisting  ANESTHESIA:   general  EBL:  Total I/O In: [id number] [I.V.:4000; Blood:1005; IV Piggyback:250] Out: 2650 [Urine:50; Blood:2600]  BLOOD ADMINISTERED:2 units CC PRBC  DRAINS: foley bulb in the endometrium  LOCAL MEDICATIONS USED:  MARCAINE     SPECIMEN:  Source of Specimen:  uterine fibroids  DISPOSITION OF SPECIMEN:  PATHOLOGY  COUNTS:  YES  TOURNIQUET:  * No tourniquets in log *  DICTATION: .Note written in EPIC  PLAN OF CARE: Admit to inpatient   PATIENT DISPOSITION:  PACU - hemodynamically stable.   Delay start of Pharmacological VTE agent (>24hrs) due to surgical blood loss or risk of bleeding: yes  Lama Narayanan L. Harraway-Smith, M.D., [name]

## 2016-10-04 NOTE — Anesthesia Procedure Notes (Signed)
Procedure Name: Intubation Date/Time: 10/04/2016 8:47 AM Performed by: Tobin Chad Pre-anesthesia Checklist: Patient identified, Suction available, Patient being monitored and Emergency Drugs available Patient Re-evaluated:Patient Re-evaluated prior to inductionOxygen Delivery Method: Circle system utilized and Simple face mask Preoxygenation: Pre-oxygenation with 100% oxygen Intubation Type: IV induction and Inhalational induction Ventilation: Mask ventilation without difficulty Laryngoscope Size: Mac and 3 Grade View: Grade II Tube type: Oral Tube size: 7.0 mm Number of attempts: 1 Airway Equipment and Method: Stylet Placement Confirmation: ETT inserted through vocal cords under direct vision,  positive ETCO2 and breath sounds checked- equal and bilateral Secured at: 21 cm Tube secured with: Tape Dental Injury: Teeth and Oropharynx as per pre-operative assessment

## 2016-10-04 NOTE — H&P (Signed)
Preoperative History and Physical  [name] is a 36 y.o. [id number] here for surgical management of fibroid uterus .   Proposed surgery: myomectomy via laparotomy  Past Medical History:  Diagnosis Date  . Asthma   . Carpal tunnel syndrome   . Vaginal Pap smear, abnormal    Past Surgical History:  Procedure Laterality Date  . COLPOSCOPY    . TONSILLECTOMY     OB History    Gravida Para Term Preterm AB Living   2 2 2     2    SAB TAB Ectopic Multiple Live Births           2     Patient denies any cervical dysplasia or STIs. Prescriptions Prior to Admission  Medication Sig Dispense Refill Last Dose  . BIOTIN 5000 PO Take 5,000 mg by mouth daily.   Past Week at Unknown time  . Cholecalciferol (VITAMIN D3) 1000 units CAPS Take 1,000 Units by mouth daily.   Past Week at Unknown time  . ibuprofen (ADVIL,MOTRIN) 200 MG tablet Take 800 mg by mouth daily as needed for headache or moderate pain.   Past Week at Unknown time  . Multiple Vitamins-Minerals (MULTIVITAMIN ADULT PO) Take 1 tablet by mouth daily.   Past Week at Unknown time  . Omega-3 Fatty Acids (FISH OIL) 1200 MG CAPS Take 1,200 mg by mouth daily.   Past Week at Unknown time  . Phenylephrine-DM-GG-APAP (TYLENOL COLD/FLU SEVERE PO) Take by mouth.   Past Week at Unknown time  . pseudoephedrine-acetaminophen (TYLENOL SINUS) 30-500 MG TABS tablet Take 1 tablet by mouth every 4 (four) hours as needed.   10/03/2016 at 1700  . TURMERIC PO Take 1 tablet by mouth daily.   Past Week at Unknown time  . ibuprofen (ADVIL,MOTRIN) 800 MG tablet Take 1 tablet (800 mg total) by mouth every 8 (eight) hours as needed. 30 tablet 2 Taking    No Known Allergies Social History:   reports that she has been smoking E-cigarettes.  She has never used smokeless tobacco. She reports that she drinks alcohol. She reports that she does not use drugs. Family History  Problem Relation Age of Onset  . Hypertension Mother   . Cancer Mother 50    breast     Review of Systems: Noncontributory  PHYSICAL EXAM: Blood pressure (!) 124/108, pulse 84, temperature 98 F (36.7 C), temperature source Oral, resp. rate 16, height 5\' 5"  (1.651 m), weight 239 lb (108.4 kg), SpO2 98 %. General appearance - alert, well appearing, and in no distress Chest - clear to auscultation, no wheezes, rales or rhonchi, symmetric air entry Heart - normal rate and regular rhythm Abdomen - soft, nontender, nondistended, no masses or organomegaly Pelvic - examination not indicated Extremities - peripheral pulses normal, no pedal edema, no clubbing or cyanosis  Labs: Results for orders placed or performed during the hospital encounter of 10/04/16 (from the past 336 hour(s))  Pregnancy, urine   Collection Time: 10/04/16  7:10 AM  Result Value Ref Range   Preg Test, Ur NEGATIVE NEGATIVE  Results for orders placed or performed during the hospital encounter of 09/28/16 (from the past 336 hour(s))  CBC   Collection Time: 09/28/16 12:05 PM  Result Value Ref Range   WBC 11.0 (H) 4.0 - 10.5 K/uL   RBC 4.83 3.87 - 5.11 MIL/uL   Hemoglobin 14.7 12.0 - 15.0 g/dL   HCT 43.2 36.0 - 46.0 %   MCV 89.4 78.0 - 100.0 fL  MCH 30.4 26.0 - 34.0 pg   MCHC 34.0 30.0 - 36.0 g/dL   RDW 12.1 11.5 - 15.5 %   Platelets 312 150 - 400 K/uL  Type and screen [place]   Collection Time: 09/28/16 12:05 PM  Result Value Ref Range   ABO/RH(D) B POS    Antibody Screen NEG    Sample Expiration 10/12/2016    Extend sample reason NO TRANSFUSIONS OR PREGNANCY IN THE PAST 3 MONTHS     Imaging Studies: No results found.  Assessment: Patient Active Problem List   Diagnosis Date Noted  . Enlarged uterus 06/28/2016    Plan: Patient will undergo surgical management with myomectomy via lapaortomy.   The risks of surgery were discussed in detail with the patient including but not limited to: bleeding which may require transfusion or reoperation; infection which may  require antibiotics; injury to surrounding organs which may involve bowel, bladder, ureters ; need for additional procedures including laparoscopy or laparotomy; thromboembolic phenomenon, surgical site problems and other postoperative/anesthesia complications. Likelihood of success in alleviating the patient's condition was discussed. Routine postoperative instructions will be reviewed with the patient and her family in detail after surgery.  The patient concurred with the proposed plan, giving informed written consent for the surgery.  Patient has been NPO since last night she will remain NPO for procedure.  Anesthesia and OR aware.  Preoperative prophylactic antibiotics and SCDs ordered on call to the OR.  To OR when ready.  Izaiah Tabb L. Harraway-Smith, M.D., [facility] 10/04/2016 8:09 AM

## 2016-10-04 NOTE — Op Note (Addendum)
10/04/2016  12:03 PM  PATIENT:  [name]  36 y.o. female  PRE-OPERATIVE DIAGNOSIS:  cpt [phone] - Symptomatic uterine fibroids  POST-OPERATIVE DIAGNOSIS:  symptomatic uterine fibroids; acute blood loss anemia  PROCEDURE:  Procedure(s): MYOMECTOMY (N/A)  SURGEON:  Surgeon(s) and Role:    * Lavonia Drafts, MD - Primary    * Donnamae Jude, MD - Assisting  ANESTHESIA:   general  EBL:  Total I/O In: [id number] [I.V.:4000; Blood:1005; IV Piggyback:250] Out: 2650 [Urine:50; Blood:2600]  BLOOD ADMINISTERED:2 units CC PRBC  DRAINS: foley bulb in the endometrium  LOCAL MEDICATIONS USED:  MARCAINE     SPECIMEN:  Source of Specimen:  uterine fibroids  DISPOSITION OF SPECIMEN:  PATHOLOGY  COUNTS:  YES  TOURNIQUET:  * No tourniquets in log *  DICTATION: .Note written in EPIC  PLAN OF CARE: Admit to inpatient   PATIENT DISPOSITION:  PACU - hemodynamically stable.   Delay start of Pharmacological VTE agent (>24hrs) due to surgical blood loss or risk of bleeding: yes   36 yo G2P2 with symptomatic fibroids here for abdominal myomectomy for symptomatic uterine leiomyomata. The risks of surgery were discussed with the patient including but not limited to: bleeding which may require transfusion or reoperation; infection which may require antibiotics; injury to bowel, bladder, ureters or other surrounding organs; need for additional procedures; thromboembolic phenomenon, incisional problems and other postoperative/anesthesia complications including hysterectomy.  Written informed consent was obtained.    PROCEDURE IN DETAIL:  The patient received IV preoperative antibiotics approximately 30 minutes prior to procedure.  She was then taken to the operating room and general anesthesia was administered without difficulty.  .She was placed in dorsal supine position and prepped and draped in a sterile manner.  A Foley catheter was inserted into the bladder and attached to constant drainage.   After an adequate timeout was performed, attention was then turned to the abdomen where a Pfannenstiel incision was made with a scalpel.  This incision was carried down to the fascia using electrocautery.  The fascia was incised in the midline and this incision was extended bilaterally using the Mayo scissors.  Kocher's were applied to the superior aspect of the fascial incision, and the rectus muscles were dissected off bluntly and sharply using the Mayo scissors.  A similar process was carried out at the inferior aspect of the incision.  The rectus muscles were separated in the midline bluntly and the peritoneum was identified, picked up and incised with the scalpel.  This incision was extended superiorly and inferiorly using the scalpel with good visualization of the bladder and bowel.  Attention was then turned to the uterus muiltiple uterine leiomyomata were noted. The uterus was then delivered up through the incision and attention was then turned to the uterine surface where the largest leiomyoma was identified posteriorly and Vasopressin solution was injected over the surface of the leiomyoma to aid with hemostasis.   A vertical incision was made over the uterine leiomyoma into the leiomyoma, and the capsule was recognized.  Using blunt and sharp methods, the leiomyoma was freed from the surrounding myometrial tissue and removed intact.  Approximately 34 other leiomyomata were removed in similar fashion from the same incision. A separate incision was made on the left side of the fundus and removed fairly superficially to removed 2 fibroids.   After removal of all the leiomyomata which were both on the anterior and posterior aspects of the uterus, the incisions were closed in layers, initiated by using  0 Vicryl in a running stitch.  The deeper layers were also reapproximated using 0 Vicryl running stitches, and the serosa was reapproximated using 0 Vicryl imbricating stitch.  Overall good hemostasis was noted.    The uterus was returned to the abdomen.  The laparotomy sponges were then removed from the abdomen.  The pelvic was irrigated with warmed normal saline. Excellent hemostasis was noted. The muscle and peritoneum were reapproximated using 1 figure of eight suture of 0 vicryl.  The fascia was reapproximated with 0 Vicryl running stitch and the subcutaneous layer was reapproximated with 3-0 vicryl in a running suture.  The skin was closed with 4-0 Vicryl subcuticular stitch using a Lanny Hurst needle.  The patient tolerated the procedure well. There were no complications during this case.  Sponge, lap, needle and instrument counts were correct x2.  At the end of the laparotomy the patient was placed in the lithotomy position and a speculum was placed in the vagina a and a single toothed tenaculum was placed in the vagina.  A foley bulb was placed in the endometrium and the balloon as dilated to 20cc. The speculum was removed.  The patient was taken to the recovery room extubated and in stable condition.  Due to the amount of blood loss 2 units of PRBCs were given intraoperatively.    Brooklee Michelin L. Harraway-Smith, M.D., [name]

## 2016-10-04 NOTE — Anesthesia Postprocedure Evaluation (Signed)
Anesthesia Post Note  Patient: [name]  Procedure(s) Performed: Procedure(s) (LRB): MYOMECTOMY (N/A)  Patient location during evaluation: [facility] Anesthesia Type: General Level of consciousness: awake and alert Pain management: pain level controlled Vital Signs Assessment: post-procedure vital signs reviewed and stable Respiratory status: spontaneous breathing Cardiovascular status: blood pressure returned to baseline Postop Assessment: no headache, patient able to bend at knees, no backache, no signs of nausea or vomiting and adequate PO intake Anesthetic complications: no        Last Vitals:  Vitals:   10/04/16 1705 10/04/16 1810  BP: (!) 109/54 114/68  Pulse: (!) 109 (!) 107  Resp: 19 16  Temp: 36.7 C 36.6 C    Last Pain:  Vitals:   10/04/16 1810  TempSrc: Oral  PainSc:    Pain Goal: Patients Stated Pain Goal: 2 (10/04/16 1535)               Birdena Crandall, Velvet Bathe

## 2016-10-05 ENCOUNTER — Encounter (HOSPITAL_COMMUNITY): Payer: Self-pay | Admitting: Obstetrics & Gynecology

## 2016-10-05 LAB — CBC WITH DIFFERENTIAL/PLATELET
BASOS ABS: 0 10*3/uL (ref 0.0–0.1)
BASOS PCT: 0 %
Band Neutrophils: 0 %
Blasts: 0 %
Eosinophils Absolute: 0 10*3/uL (ref 0.0–0.7)
Eosinophils Relative: 0 %
HCT: 18.6 % — ABNORMAL LOW (ref 36.0–46.0)
Hemoglobin: 6.7 g/dL — CL (ref 12.0–15.0)
LYMPHS ABS: 3.4 10*3/uL (ref 0.7–4.0)
Lymphocytes Relative: 20 %
MCH: 31 pg (ref 26.0–34.0)
MCHC: 36 g/dL (ref 30.0–36.0)
MCV: 86.1 fL (ref 78.0–100.0)
METAMYELOCYTES PCT: 0 %
MONO ABS: 1 10*3/uL (ref 0.1–1.0)
MYELOCYTES: 0 %
Monocytes Relative: 6 %
NEUTROS ABS: 12.5 10*3/uL — AB (ref 1.7–7.7)
Neutrophils Relative %: 74 %
Other: 0 %
PLATELETS: 141 10*3/uL — AB (ref 150–400)
Promyelocytes Absolute: 0 %
RBC: 2.16 MIL/uL — ABNORMAL LOW (ref 3.87–5.11)
RDW: 14.7 % (ref 11.5–15.5)
WBC: 16.9 10*3/uL — ABNORMAL HIGH (ref 4.0–10.5)
nRBC: 0 /100 WBC

## 2016-10-05 LAB — CBC
HEMATOCRIT: 22.6 % — AB (ref 36.0–46.0)
HEMOGLOBIN: 7.8 g/dL — AB (ref 12.0–15.0)
MCH: 29.5 pg (ref 26.0–34.0)
MCHC: 34.5 g/dL (ref 30.0–36.0)
MCV: 85.6 fL (ref 78.0–100.0)
Platelets: 159 10*3/uL (ref 150–400)
RBC: 2.64 MIL/uL — ABNORMAL LOW (ref 3.87–5.11)
RDW: 15.1 % (ref 11.5–15.5)
WBC: 18.4 10*3/uL — ABNORMAL HIGH (ref 4.0–10.5)

## 2016-10-05 LAB — PREPARE RBC (CROSSMATCH)

## 2016-10-05 MED ORDER — DIPHENHYDRAMINE HCL 25 MG PO CAPS
25.0000 mg | ORAL_CAPSULE | Freq: Once | ORAL | Status: AC
  Administered 2016-10-05: 25 mg via ORAL
  Filled 2016-10-05: qty 1

## 2016-10-05 MED ORDER — METRONIDAZOLE 500 MG PO TABS
500.0000 mg | ORAL_TABLET | Freq: Two times a day (BID) | ORAL | Status: DC
  Administered 2016-10-05 – 2016-10-07 (×5): 500 mg via ORAL
  Filled 2016-10-05 (×5): qty 1

## 2016-10-05 MED ORDER — ACETAMINOPHEN 325 MG PO TABS
650.0000 mg | ORAL_TABLET | Freq: Once | ORAL | Status: AC
  Administered 2016-10-05: 650 mg via ORAL
  Filled 2016-10-05: qty 2

## 2016-10-05 MED ORDER — DOXYCYCLINE HYCLATE 100 MG PO TABS
100.0000 mg | ORAL_TABLET | Freq: Two times a day (BID) | ORAL | Status: DC
  Administered 2016-10-05 – 2016-10-07 (×5): 100 mg via ORAL
  Filled 2016-10-05 (×7): qty 1

## 2016-10-05 MED ORDER — SODIUM CHLORIDE 0.9 % IV SOLN
Freq: Once | INTRAVENOUS | Status: AC
  Administered 2016-10-05: via INTRAVENOUS

## 2016-10-05 MED ORDER — IBUPROFEN 600 MG PO TABS
600.0000 mg | ORAL_TABLET | Freq: Four times a day (QID) | ORAL | Status: DC
  Administered 2016-10-05 – 2016-10-07 (×6): 600 mg via ORAL
  Filled 2016-10-05 (×6): qty 1

## 2016-10-05 MED ORDER — ESTRADIOL 2 MG PO TABS
2.0000 mg | ORAL_TABLET | Freq: Every day | ORAL | Status: DC
  Administered 2016-10-05 – 2016-10-07 (×3): 2 mg via ORAL
  Filled 2016-10-05 (×4): qty 1

## 2016-10-05 NOTE — Progress Notes (Signed)
1 Day Post-Op Procedure(s) (LRB): MYOMECTOMY (N/A)  Subjective: Patient reports feeling 'sore' but, no severe pain. She walked in the halls x 1 today at 0400.  She denies an appetite but has no N/V.  Pt denies SOB or dizziness.  Objective: BP (!) 119/57 (BP Location: Right Arm)   Pulse (!) 101   Temp 99 F (37.2 C) (Oral)   Resp 16   Ht 5\' 5"  (1.651 m)   Wt 239 lb (108.4 kg)   LMP  (LMP Unknown) Comment: 10 years ago  SpO2 98%   BMI 39.77 kg/m  I/O last 3 completed shifts: In: 83 [P.O.:600; I.V.:4750; Blood:1005; IV Piggyback:750] Out: [id number] [Urine:1125; Blood:2600] Total I/O In: -  Out: 4 [Urine:650]  I have reviewed patient's vital signs, intake and output, medications and labs.  General: alert and no distress Resp: clear to auscultation bilaterally Cardio: regular rate and rhythm, S1, S2 normal, no murmur, click, rub or gallop GI: normal findings: bowel sounds normal and incision: clean, dry and intact Extremities: extremities normal, atraumatic, no cyanosis or edema Vaginal Bleeding: minimal; endometrial catheter in place.  CBC    Component Value Date/Time   WBC 18.4 (H) 10/05/2016 0544   RBC 2.64 (L) 10/05/2016 0544   HGB 7.8 (L) 10/05/2016 0544   HCT 22.6 (L) 10/05/2016 0544   PLT 159 10/05/2016 0544   MCV 85.6 10/05/2016 0544   MCH 29.5 10/05/2016 0544   MCHC 34.5 10/05/2016 0544   RDW 15.1 10/05/2016 0544   LYMPHSABS 4,000 (H) 08/15/2016 1108   MONOABS 600 08/15/2016 1108   EOSABS 200 08/15/2016 1108   BASOSABS 0 08/15/2016 1108    Assessment: s/p Procedure(s): MYOMECTOMY (N/A): stable and progressing well  Plan: Encourage ambulation Advance diet  Remove IV Begin Doxycycline and Flagyl due to indwelling endometrial catheter to protect endometrium from infxn for 7 days Begin Estrace 2mg  po daily for 7 days while endometrium heals. Remove urethral Foley  Repeat CBC in am  abd binder.       LOS: 1 day    Lavonia Drafts 10/05/2016,  10:47 AM

## 2016-10-06 LAB — CBC
HCT: 24.2 % — ABNORMAL LOW (ref 36.0–46.0)
HCT: 24.8 % — ABNORMAL LOW (ref 36.0–46.0)
HEMOGLOBIN: 8.9 g/dL — AB (ref 12.0–15.0)
Hemoglobin: 8.1 g/dL — ABNORMAL LOW (ref 12.0–15.0)
MCH: 29.6 pg (ref 26.0–34.0)
MCH: 31 pg (ref 26.0–34.0)
MCHC: 33.5 g/dL (ref 30.0–36.0)
MCHC: 35.9 g/dL (ref 30.0–36.0)
MCV: 88.3 fL (ref 78.0–100.0)
MCV: 86.4 fL (ref 78.0–100.0)
PLATELETS: 148 10*3/uL — AB (ref 150–400)
Platelets: 155 10*3/uL (ref 150–400)
RBC: 2.74 MIL/uL — ABNORMAL LOW (ref 3.87–5.11)
RBC: 2.87 MIL/uL — ABNORMAL LOW (ref 3.87–5.11)
RDW: 14.1 % (ref 11.5–15.5)
RDW: 14.8 % (ref 11.5–15.5)
WBC: 14.9 10*3/uL — AB (ref 4.0–10.5)
WBC: 15.5 10*3/uL — ABNORMAL HIGH (ref 4.0–10.5)

## 2016-10-06 LAB — DIC (DISSEMINATED INTRAVASCULAR COAGULATION)PANEL
INR: 1.25
Platelets: 186 10*3/uL (ref 150–400)
Smear Review: NONE SEEN

## 2016-10-06 LAB — PREPARE FRESH FROZEN PLASMA: Unit division: 0

## 2016-10-06 LAB — DIC (DISSEMINATED INTRAVASCULAR COAGULATION) PANEL
APTT: 30 s (ref 24–36)
FIBRINOGEN: 184 mg/dL — AB (ref 210–475)
PROTHROMBIN TIME: 15.8 s — AB (ref 11.4–15.2)

## 2016-10-06 NOTE — Progress Notes (Signed)
2 Days Post-Op Procedure(s) (LRB): MYOMECTOMY (N/A)  Subjective: Patient reports tolerating PO, + flatus and no problems voiding.  She reports dizziness with rising last night and feels a bit better today but, c/o nausea.  Objective: I have reviewed patient's vital signs, intake and output, medications and labs.  General: alert and mild distress Resp: clear to auscultation bilaterally Cardio: regular rate and rhythm, S1, S2 normal, no murmur, click, rub or gallop GI: soft, non-tender; bowel sounds normal; no masses,  no organomegaly Extremities: extremities normal, atraumatic, no cyanosis or edema Incision clean and dry  Assessment: s/p Procedure(s): MYOMECTOMY (N/A): progressing well, fever and anemia- pt was tachycardiac and had a temp overnight.   Plan: Advance diet Encourage ambulation Will observe overnigth and follow VS.  Repeat CBC  LOS: 2 days    Lavonia Drafts 10/06/2016, 7:49 AM

## 2016-10-07 LAB — CBC
HCT: 24.2 % — ABNORMAL LOW (ref 36.0–46.0)
Hemoglobin: 8.6 g/dL — ABNORMAL LOW (ref 12.0–15.0)
MCH: 30.8 pg (ref 26.0–34.0)
MCHC: 35.5 g/dL (ref 30.0–36.0)
MCV: 86.7 fL (ref 78.0–100.0)
PLATELETS: 179 10*3/uL (ref 150–400)
RBC: 2.79 MIL/uL — AB (ref 3.87–5.11)
RDW: 14.6 % (ref 11.5–15.5)
WBC: 12.6 10*3/uL — AB (ref 4.0–10.5)

## 2016-10-07 MED ORDER — ESTRADIOL 2 MG PO TABS: 2.0000 mg | ORAL_TABLET | Freq: Every day | ORAL | 0 refills | Status: DC

## 2016-10-07 MED ORDER — OXYCODONE-ACETAMINOPHEN 5-325 MG PO TABS: 1.0000 | ORAL_TABLET | ORAL | 0 refills | Status: DC | PRN

## 2016-10-07 MED ORDER — DOCUSATE SODIUM 100 MG PO CAPS: 100.0000 mg | ORAL_CAPSULE | Freq: Two times a day (BID) | ORAL | 0 refills | Status: DC

## 2016-10-07 MED ORDER — METRONIDAZOLE 500 MG PO TABS: 500.0000 mg | ORAL_TABLET | Freq: Two times a day (BID) | ORAL | 0 refills | Status: DC

## 2016-10-07 MED ORDER — IBUPROFEN 600 MG PO TABS: 600.0000 mg | ORAL_TABLET | Freq: Four times a day (QID) | ORAL | 0 refills | Status: AC

## 2016-10-07 MED ORDER — DOXYCYCLINE HYCLATE 100 MG PO TABS: 100.0000 mg | ORAL_TABLET | Freq: Two times a day (BID) | ORAL | 0 refills | Status: DC

## 2016-10-07 NOTE — Discharge Summary (Addendum)
Physician Discharge Summary  Patient ID: [name] MRN: [id number] DOB/AGE: 34-Feb-1982 34 y.o.  Admit date: 10/04/2016 Discharge date: 10/07/2016  Admission Diagnoses: Uterine fibroids Acute blood loss anemia  Discharge Diagnoses:  Principal Problem:   Fibroid uterus Active Problems:   Postoperative anemia due to acute blood loss   Postoperative state   Discharged Condition: good  Hospital Course: Patient had a surgery complicated by excessive blood loss due to removal of fibroids. She received 2 units of PRBC intraop and 2 units of PRBC on POD #1. For further details of this surgery, please refer to the operative note. Her surgery and hospital stay was otherwise uncomplicated.   By time of discharge, her pain was controlled on oral pain medications; she was ambulating, voiding without difficulty, tolerating regular diet and passing flatus.  She was deemed stable for discharge to home.  She will be discharged with a balloon in the endometrial cavity that will be removed in 5 days .  Consults: None  Significant Diagnostic Studies: labs: CBC; type and cross  Treatments: IV hydration, antibiotics: metronidazole and doxycycline, procedures: blood transfusion and surgery: abdominal myomectomy  Discharge Exam: Blood pressure 103/64, pulse 95, temperature 98 F (36.7 C), temperature source Oral, resp. rate 18, height 5\' 5"  (1.651 m), weight 239 lb (108.4 kg), SpO2 99 %. General appearance: alert and no distress Resp: clear to auscultation bilaterally Cardio: regular rate and rhythm, S1, S2 normal, no murmur, click, rub or gallop GI: soft, non-tender; bowel sounds normal; no masses,  no organomegaly Extremities: extremities normal, atraumatic, no cyanosis or edema Incision/Wound:clean/dry and intact  CBC Latest Ref Rng & Units 10/07/2016 10/06/2016 10/06/2016  WBC 4.0 - 10.5 K/uL 12.6(H) 15.5(H) 14.9(H)  Hemoglobin 12.0 - 15.0 g/dL 8.6(L) 8.9(L) 8.1(L)  Hematocrit 36.0 - 46.0 %  24.2(L) 24.8(L) 24.2(L)  Platelets 150 - 400 K/uL 179 155 148(L)    Disposition: 01-Home or Self Care  Discharge Instructions    Call MD for:  difficulty breathing, headache or visual disturbances    Complete by:  As directed    Call MD for:  extreme fatigue    Complete by:  As directed    Call MD for:  hives    Complete by:  As directed    Call MD for:  persistant dizziness or light-headedness    Complete by:  As directed    Call MD for:  persistant nausea and vomiting    Complete by:  As directed    Call MD for:  redness, tenderness, or signs of infection (pain, swelling, redness, odor or green/yellow discharge around incision site)    Complete by:  As directed    Call MD for:  severe uncontrolled pain    Complete by:  As directed    Call MD for:  temperature >100.4    Complete by:  As directed    Diet - low sodium heart healthy    Complete by:  As directed    Discharge instructions    Complete by:  As directed    Pt to be discharged with catheter in endometrium.  To be removed in office in 5 days   Driving Restrictions    Complete by:  As directed    No driving for 2 weeks   Increase activity slowly    Complete by:  As directed    Lifting restrictions    Complete by:  As directed    No heavy lifting   Sexual Activity Restrictions  Complete by:  As directed    No sexual activity for 6 weeks     Allergies as of 10/07/2016   No Known Allergies     Medication List    STOP taking these medications   pseudoephedrine-acetaminophen 30-500 MG Tabs tablet Commonly known as:  TYLENOL SINUS     TAKE these medications   BIOTIN 5000 PO Take 5,000 mg by mouth daily.   docusate sodium 100 MG capsule Commonly known as:  COLACE Take 1 capsule (100 mg total) by mouth 2 (two) times daily.   doxycycline 100 MG tablet Commonly known as:  VIBRA-TABS Take 1 tablet (100 mg total) by mouth every 12 (twelve) hours.   estradiol 2 MG tablet Commonly known as:  ESTRACE Take 1  tablet (2 mg total) by mouth daily. Start taking on:  10/08/2016   Fish Oil 1200 MG Caps Take 1,200 mg by mouth daily.   ibuprofen 600 MG tablet Commonly known as:  ADVIL,MOTRIN Take 1 tablet (600 mg total) by mouth every 6 (six) hours. What changed:  medication strength  how much to take  when to take this  reasons to take this  Another medication with the same name was removed. Continue taking this medication, and follow the directions you see here.   metroNIDAZOLE 500 MG tablet Commonly known as:  FLAGYL Take 1 tablet (500 mg total) by mouth every 12 (twelve) hours.   MULTIVITAMIN ADULT PO Take 1 tablet by mouth daily.   oxyCODONE-acetaminophen 5-325 MG tablet Commonly known as:  PERCOCET/ROXICET Take 1-2 tablets by mouth every 4 (four) hours as needed (moderate to severe pain (when tolerating fluids)).   TURMERIC PO Take 1 tablet by mouth daily.   TYLENOL COLD/FLU SEVERE PO Take by mouth.   Vitamin D3 1000 units Caps Take 1,000 Units by mouth daily.      Follow-up Information    [facility] Follow up.   Why:  Pt has appointment Contact information: [address], [place] [phone] [id number]          Signed: Lavonia Drafts 10/07/2016, 2:00 PM

## 2016-10-07 NOTE — Discharge Instructions (Signed)
Myomectomy, Care After Refer to this sheet in the next few weeks. These instructions provide you with information on caring for yourself after your procedure. Your health care provider may also give you more specific instructions. Your treatment has been planned according to current medical practices, but problems sometimes occur. Call your health care provider if you have any problems or questions after your procedure. WHAT TO EXPECT AFTER THE PROCEDURE After your procedure, it is typical to have the following:  Pain in your abdomen, especially at any incision sites. You will be given pain medicine to control the pain.  Tiredness. This is a normal part of the recovery process. Your energy level will return to normal over the next several weeks.  Constipation.  Vaginal bleeding. This is normal and should stop after 1-2 weeks. HOME CARE INSTRUCTIONS   Only take over-the-counter or prescription medicines as directed by your health care provider. Avoid aspirin because it can cause bleeding.  Do not douche, use tampons, or have sexual intercourse until given permission by your health care provider.  Remove or change any bandages (dressings) as directed by your health care provider.  Take showers instead of baths as directed by your health care provider.  You will probably be able to go back to your normal routine after a few days. Do not do anything that requires extra effort until your health care provider says it is okay. Do not lift anything heavier than 15 pounds (6.8 kg) until your health care provider approves.  Walk daily but take frequent rest breaks if you tire easily.  Continue to practice deep breathing and coughing. If it hurts to cough, try holding a pillow against your belly as you cough.  If you become constipated, you may:  Use a mild laxative if your health care provider approves.  Add more fruit and bran to your diet.  Drink enough fluids to keep your urine clear or  pale yellow.  Take your temperature twice a day and write it down.  Do not drink alcohol.  Do not drive until your health care provider approves.  Have someone help you at home for 1 week or until you can do your own household activities.  Follow up with your health care provider as directed. SEEK MEDICAL CARE IF:  You have a fever.  You have increasing abdominal pain that is not relieved with medicine.  You have nausea, vomiting, or diarrhea.  You have pain when you urinate, or you have blood in your urine.  You have a rash on your body.  You have pain or redness where your IV access tube was inserted.  You have redness, swelling, or any kind of drainage from an incision. SEEK IMMEDIATE MEDICAL CARE IF:   You have weakness or lightheadedness.  You have pain, swelling, or redness in your legs.  You have chest pain.  You faint.  You have shortness of breath.  You have heavy vaginal bleeding.  Your incision is opening up. This information is not intended to replace advice given to you by your health care provider. Make sure you discuss any questions you have with your health care provider. Document Released: 02/02/2011 Document Revised: 10/03/2014 Document Reviewed: 04/24/2013 Elsevier Interactive Patient Education  2017 [employer].

## 2016-10-07 NOTE — Progress Notes (Signed)
Pt ambulated out teaching complete  

## 2016-10-11 LAB — TYPE AND SCREEN
BLOOD PRODUCT EXPIRATION DATE: 201801172359
BLOOD PRODUCT EXPIRATION DATE: 201801172359
BLOOD PRODUCT EXPIRATION DATE: 201801242359
BLOOD PRODUCT EXPIRATION DATE: 201802012359
Blood Product Expiration Date: 201801212359
Blood Product Expiration Date: 201801212359
Blood Product Expiration Date: 201801242359
ISSUE DATE / TIME: 201801091019
ISSUE DATE / TIME: 201801130947
ISSUE DATE / TIME: 201801091019
ISSUE DATE / TIME: 201801091049
ISSUE DATE / TIME: 201801102353
ISSUE DATE / TIME: 201801110346
UNIT TYPE AND RH: 5100
UNIT TYPE AND RH: 5100
UNIT TYPE AND RH: 9500
Unit Type and Rh: 5100
Unit Type and Rh: 5100
Unit Type and Rh: 5100
Unit Type and Rh: 9500

## 2016-10-12 ENCOUNTER — Encounter: Payer: Self-pay | Admitting: Obstetrics & Gynecology

## 2016-10-14 ENCOUNTER — Other Ambulatory Visit: Payer: Self-pay | Admitting: Obstetrics & Gynecology

## 2016-10-14 DIAGNOSIS — N859 Noninflammatory disorder of uterus, unspecified: Secondary | ICD-10-CM

## 2016-10-14 DIAGNOSIS — N949 Unspecified condition associated with female genital organs and menstrual cycle: Secondary | ICD-10-CM

## 2016-10-14 MED ORDER — ESTRADIOL 2 MG PO TABS: 2.0000 mg | ORAL_TABLET | Freq: Every day | ORAL | 0 refills | Status: DC

## 2016-10-14 MED ORDER — METRONIDAZOLE 500 MG PO TABS: 500.0000 mg | ORAL_TABLET | Freq: Two times a day (BID) | ORAL | 0 refills | Status: DC

## 2016-10-14 MED ORDER — DOXYCYCLINE HYCLATE 100 MG PO TABS: 100.0000 mg | ORAL_TABLET | Freq: Two times a day (BID) | ORAL | 0 refills | Status: AC

## 2016-10-16 ENCOUNTER — Encounter (HOSPITAL_COMMUNITY): Payer: Self-pay | Admitting: *Deleted

## 2016-10-16 ENCOUNTER — Inpatient Hospital Stay (HOSPITAL_COMMUNITY)
Admission: AD | Admit: 2016-10-16 | Discharge: 2016-10-16 | Disposition: A | Discharge status: Home / Self Care | Payer: Medicaid Other | Source: Ambulatory Visit | Attending: Obstetrics & Gynecology | Admitting: Obstetrics & Gynecology

## 2016-10-16 DIAGNOSIS — R42 Dizziness and giddiness: Secondary | ICD-10-CM | POA: Diagnosis not present

## 2016-10-16 DIAGNOSIS — Z9889 Other specified postprocedural states: Secondary | ICD-10-CM | POA: Diagnosis not present

## 2016-10-16 DIAGNOSIS — Z452 Encounter for adjustment and management of vascular access device: Secondary | ICD-10-CM | POA: Diagnosis present

## 2016-10-16 DIAGNOSIS — D251 Intramural leiomyoma of uterus: Secondary | ICD-10-CM

## 2016-10-16 DIAGNOSIS — D252 Subserosal leiomyoma of uterus: Secondary | ICD-10-CM

## 2016-10-16 DIAGNOSIS — Z79899 Other long term (current) drug therapy: Secondary | ICD-10-CM | POA: Insufficient documentation

## 2016-10-16 DIAGNOSIS — F17210 Nicotine dependence, cigarettes, uncomplicated: Secondary | ICD-10-CM | POA: Diagnosis not present

## 2016-10-16 DIAGNOSIS — D250 Submucous leiomyoma of uterus: Secondary | ICD-10-CM

## 2016-10-16 LAB — CBC
HEMATOCRIT: 33.7 % — AB (ref 36.0–46.0)
Hemoglobin: 11.4 g/dL — ABNORMAL LOW (ref 12.0–15.0)
MCH: 30.4 pg (ref 26.0–34.0)
MCHC: 33.8 g/dL (ref 30.0–36.0)
MCV: 89.9 fL (ref 78.0–100.0)
Platelets: 560 10*3/uL — ABNORMAL HIGH (ref 150–400)
RBC: 3.75 MIL/uL — AB (ref 3.87–5.11)
RDW: 15.3 % (ref 11.5–15.5)
WBC: 12.1 10*3/uL — ABNORMAL HIGH (ref 4.0–10.5)

## 2016-10-16 NOTE — MAU Note (Signed)
Pt missed apt for removal due to snow, instructed to come here to remove balloon.

## 2016-10-16 NOTE — MAU Provider Note (Signed)
History     CSN: [id number]  Arrival date and time: 10/16/16 1110   None     Chief Complaint  Patient presents with  . remove cath   HPI Pt is s/p myomectomy 10/04/2016.  She went home with a catheter in her endometrium to prevent scarring. She was scheduled to f/u in 1 week however, the office was closed due to an ice strom so pt is here today for removal of the catheter. She c/o only mild pain but, does reports some dizziness on occ. She is only occ taking pain meds.   Past Medical History:  Diagnosis Date  . Asthma   . Carpal tunnel syndrome   . Vaginal Pap smear, abnormal     Past Surgical History:  Procedure Laterality Date  . COLPOSCOPY    . MYOMECTOMY N/A 10/04/2016   Procedure: MYOMECTOMY;  Surgeon: Lavonia Drafts, MD;  Location: [facility];  Service: Gynecology;  Laterality: N/A;  . TONSILLECTOMY      Family History  Problem Relation Age of Onset  . Hypertension Mother   . Cancer Mother 35    breast    Social History  Substance Use Topics  . Smoking status: Current Every Day Smoker    Types: E-cigarettes  . Smokeless tobacco: Never Used  . Alcohol use Yes     Comment: occasional    Allergies: No Known Allergies  Prescriptions Prior to Admission  Medication Sig Dispense Refill Last Dose  . BIOTIN 5000 PO Take 5,000 mg by mouth daily.   Past Month at Unknown time  . Cholecalciferol (VITAMIN D3) 1000 units CAPS Take 1,000 Units by mouth daily.   Past Month at Unknown time  . doxycycline (VIBRA-TABS) 100 MG tablet Take 1 tablet (100 mg total) by mouth 2 (two) times daily. 6 tablet 0 10/16/2016 at Unknown time  . estradiol (ESTRACE) 2 MG tablet Take 1 tablet (2 mg total) by mouth daily. 7 tablet 0 10/16/2016 at Unknown time  . ibuprofen (ADVIL,MOTRIN) 600 MG tablet Take 1 tablet (600 mg total) by mouth every 6 (six) hours. 30 tablet 0 10/16/2016 at Unknown time  . metroNIDAZOLE (FLAGYL) 500 MG tablet Take 1 tablet (500 mg total) by mouth 2 (two) times daily.  6 tablet 0 10/16/2016 at Unknown time  . Multiple Vitamins-Minerals (MULTIVITAMIN ADULT PO) Take 1 tablet by mouth daily.   Past Month at Unknown time  . Omega-3 Fatty Acids (FISH OIL) 1200 MG CAPS Take 1,200 mg by mouth daily.   Past Month at Unknown time  . oxyCODONE-acetaminophen (PERCOCET/ROXICET) 5-325 MG tablet Take 1-2 tablets by mouth every 4 (four) hours as needed (moderate to severe pain (when tolerating fluids)). 30 tablet 0 10/15/2016 at Unknown time  . TURMERIC PO Take 1 tablet by mouth daily.   Past Month at Unknown time  . docusate sodium (COLACE) 100 MG capsule Take 1 capsule (100 mg total) by mouth 2 (two) times daily. (Patient not taking: Reported on 10/16/2016) 10 capsule 0 Not Taking at Unknown time    Review of Systems Physical Exam   Blood pressure 135/79, pulse 92, temperature 98.1 F (36.7 C), temperature source Oral, height 5\' 5"  (1.651 m), weight 237 lb (107.5 kg).  Physical Exam Pt in NAD lungs: CTA CV: RRR Abd: soft; NT, ND.  Incision: steristrips in place and wet and matted. There were removed. The incision is clean and dry. Healing well GU: the foley catheter was removed from the endometrium without difficulty.  MAU Course  Procedures  MDM See above  CBC    Component Value Date/Time   WBC 12.1 (H) 10/16/2016 1153   RBC 3.75 (L) 10/16/2016 1153   HGB 11.4 (L) 10/16/2016 1153   HCT 33.7 (L) 10/16/2016 1153   PLT 560 (H) 10/16/2016 1153   MCV 89.9 10/16/2016 1153   MCH 30.4 10/16/2016 1153   MCHC 33.8 10/16/2016 1153   RDW 15.3 10/16/2016 1153   LYMPHSABS 3.4 10/05/2016 2213   MONOABS 1.0 10/05/2016 2213   EOSABS 0.0 10/05/2016 2213   BASOSABS 0.0 10/05/2016 2213     Assessment and Plan  1 1/2 week post op check and removal of catheter from endometrium  Pt doing well Complete EES 2mg  daily for 5 more days F/u in ofc in 4 weeks or sooner prn Gradual increase in activities Pt given s/sx for which to f/u sooner than 4 week  Tyishia Aune L.  Ihor Dow, M.D., [facility] Harraway-Smith 10/16/2016, 12:17 PM

## 2016-10-16 NOTE — Discharge Instructions (Signed)
Myomectomy, Care After Refer to this sheet in the next few weeks. These instructions provide you with information on caring for yourself after your procedure. Your health care provider may also give you more specific instructions. Your treatment has been planned according to current medical practices, but problems sometimes occur. Call your health care provider if you have any problems or questions after your procedure. WHAT TO EXPECT AFTER THE PROCEDURE After your procedure, it is typical to have the following:  Pain in your abdomen, especially at any incision sites. You will be given pain medicine to control the pain.  Tiredness. This is a normal part of the recovery process. Your energy level will return to normal over the next several weeks.  Constipation.  Vaginal bleeding. This is normal and should stop after 1-2 weeks. HOME CARE INSTRUCTIONS   Only take over-the-counter or prescription medicines as directed by your health care provider. Avoid aspirin because it can cause bleeding.  Do not douche, use tampons, or have sexual intercourse until given permission by your health care provider.  Remove or change any bandages (dressings) as directed by your health care provider.  Take showers instead of baths as directed by your health care provider.  You will probably be able to go back to your normal routine after a few days. Do not do anything that requires extra effort until your health care provider says it is okay. Do not lift anything heavier than 15 pounds (6.8 kg) until your health care provider approves.  Walk daily but take frequent rest breaks if you tire easily.  Continue to practice deep breathing and coughing. If it hurts to cough, try holding a pillow against your belly as you cough.  If you become constipated, you may:  Use a mild laxative if your health care provider approves.  Add more fruit and bran to your diet.  Drink enough fluids to keep your urine clear or  pale yellow.  Take your temperature twice a day and write it down.  Do not drink alcohol.  Do not drive until your health care provider approves.  Have someone help you at home for 1 week or until you can do your own household activities.  Follow up with your health care provider as directed. SEEK MEDICAL CARE IF:  You have a fever.  You have increasing abdominal pain that is not relieved with medicine.  You have nausea, vomiting, or diarrhea.  You have pain when you urinate, or you have blood in your urine.  You have a rash on your body.  You have pain or redness where your IV access tube was inserted.  You have redness, swelling, or any kind of drainage from an incision. SEEK IMMEDIATE MEDICAL CARE IF:   You have weakness or lightheadedness.  You have pain, swelling, or redness in your legs.  You have chest pain.  You faint.  You have shortness of breath.  You have heavy vaginal bleeding.  Your incision is opening up. This information is not intended to replace advice given to you by your health care provider. Make sure you discuss any questions you have with your health care provider. Document Released: 02/02/2011 Document Revised: 10/03/2014 Document Reviewed: 04/24/2013 Elsevier Interactive Patient Education  2017 [employer].

## 2016-10-20 ENCOUNTER — Encounter: Payer: Self-pay | Admitting: Obstetrics & Gynecology

## 2016-11-09 ENCOUNTER — Encounter: Payer: Self-pay | Admitting: Podiatry

## 2016-11-09 ENCOUNTER — Ambulatory Visit (INDEPENDENT_AMBULATORY_CARE_PROVIDER_SITE_OTHER): Payer: Medicaid Other | Admitting: Podiatry

## 2016-11-09 DIAGNOSIS — M722 Plantar fascial fibromatosis: Secondary | ICD-10-CM

## 2016-11-09 MED ORDER — TRIAMCINOLONE ACETONIDE 10 MG/ML IJ SUSP
10.0000 mg | Freq: Once | INTRAMUSCULAR | Status: AC
  Administered 2016-11-09: 10 mg

## 2016-11-09 NOTE — Progress Notes (Signed)
Subjective:     Patient ID: [name], female   DOB: [birth year], 36 y.o.   MRN: [id number]  HPI patient states her left heel has started to hurt her again   Review of Systems     Objective:   Physical Exam Neurovascular status intact with patient found to have discomfort left plantar heel present    Assessment:     Acute plantar fasciitis    Plan:     Reinjected the plantar fashion 3 Milligan Kenalog 5 mill grams Xylocaine and dispensed night splint with all instructions on

## 2016-11-16 ENCOUNTER — Ambulatory Visit (INDEPENDENT_AMBULATORY_CARE_PROVIDER_SITE_OTHER): Payer: Medicaid Other | Admitting: Obstetrics & Gynecology

## 2016-11-16 ENCOUNTER — Encounter: Payer: Self-pay | Admitting: Obstetrics & Gynecology

## 2016-11-16 VITALS — BP 120/73 | HR 90 | Ht 65.0 in | Wt 234.0 lb

## 2016-11-16 DIAGNOSIS — Z9889 Other specified postprocedural states: Secondary | ICD-10-CM

## 2016-11-16 DIAGNOSIS — D500 Iron deficiency anemia secondary to blood loss (chronic): Secondary | ICD-10-CM

## 2016-11-16 MED ORDER — INTEGRA F 125-1 MG PO CAPS: 1.0000 | ORAL_CAPSULE | Freq: Every day | ORAL | 1 refills | Status: DC

## 2016-11-16 NOTE — Patient Instructions (Signed)
Myomectomy, Care After Refer to this sheet in the next few weeks. These instructions provide you with information on caring for yourself after your procedure. Your health care provider may also give you more specific instructions. Your treatment has been planned according to current medical practices, but problems sometimes occur. Call your health care provider if you have any problems or questions after your procedure. WHAT TO EXPECT AFTER THE PROCEDURE After your procedure, it is typical to have the following:  Pain in your abdomen, especially at any incision sites. You will be given pain medicine to control the pain.  Tiredness. This is a normal part of the recovery process. Your energy level will return to normal over the next several weeks.  Constipation.  Vaginal bleeding. This is normal and should stop after 1-2 weeks. HOME CARE INSTRUCTIONS   Only take over-the-counter or prescription medicines as directed by your health care provider. Avoid aspirin because it can cause bleeding.  Do not douche, use tampons, or have sexual intercourse until given permission by your health care provider.  Remove or change any bandages (dressings) as directed by your health care provider.  Take showers instead of baths as directed by your health care provider.  You will probably be able to go back to your normal routine after a few days. Do not do anything that requires extra effort until your health care provider says it is okay. Do not lift anything heavier than 15 pounds (6.8 kg) until your health care provider approves.  Walk daily but take frequent rest breaks if you tire easily.  Continue to practice deep breathing and coughing. If it hurts to cough, try holding a pillow against your belly as you cough.  If you become constipated, you may:  Use a mild laxative if your health care provider approves.  Add more fruit and bran to your diet.  Drink enough fluids to keep your urine clear or  pale yellow.  Take your temperature twice a day and write it down.  Do not drink alcohol.  Do not drive until your health care provider approves.  Have someone help you at home for 1 week or until you can do your own household activities.  Follow up with your health care provider as directed. SEEK MEDICAL CARE IF:  You have a fever.  You have increasing abdominal pain that is not relieved with medicine.  You have nausea, vomiting, or diarrhea.  You have pain when you urinate, or you have blood in your urine.  You have a rash on your body.  You have pain or redness where your IV access tube was inserted.  You have redness, swelling, or any kind of drainage from an incision. SEEK IMMEDIATE MEDICAL CARE IF:   You have weakness or lightheadedness.  You have pain, swelling, or redness in your legs.  You have chest pain.  You faint.  You have shortness of breath.  You have heavy vaginal bleeding.  Your incision is opening up. This information is not intended to replace advice given to you by your health care provider. Make sure you discuss any questions you have with your health care provider. Document Released: 02/02/2011 Document Revised: 10/03/2014 Document Reviewed: 04/24/2013 Elsevier Interactive Patient Education  2017 [employer].

## 2016-11-16 NOTE — Progress Notes (Signed)
History:  36 y.o. [id number] here today for psot op check up. Pt is s/p a myomectomy 10/04/2016. Pt had a endometrial Foley bulb placed intraop to protect he endometrium which was removed 1 week post op. Pt reports that she has not had a period since surgery. Was was not having reg cycles prior to surgery due to Bromide which was removed at the time of surgery.  The following portions of the patient's history were reviewed and updated as appropriate: allergies, current medications, past family history, past medical history, past social history, past surgical history and problem list.  Review of Systems:  Pertinent items are noted in HPI.   Objective:  Physical Exam Blood pressure 120/73, pulse 90, height 5\' 5"  (1.651 m), weight 234 lb (106.1 kg). Gen: NAD Abd: Soft, nontender and nondistended; incision clean dry and intact.   Pelvic: not done  Labs and Imaging 10/04/2016 Diagnosis Uterine fibroid(s), and IUD - LEIOMYOMATA. - NO MALIGNANCY. - GROSS DIAGNOSIS: INTRAUTERINE DEVICE  Assessment & Plan:  6 week post op check - pt doing well Anemia due to blood loss Integra 1 po q day F/u in 3 months Gradual return to full activities  [place]. Harraway-Smith, M.D., [name]

## 2016-11-17 ENCOUNTER — Telehealth: Payer: Self-pay

## 2016-11-17 LAB — CBC
HEMATOCRIT: 40.4 % (ref 34.0–46.6)
Hemoglobin: 13.7 g/dL (ref 11.1–15.9)
MCH: 30.6 pg (ref 26.6–33.0)
MCHC: 33.9 g/dL (ref 31.5–35.7)
MCV: 90 fL (ref 79–97)
PLATELETS: 407 10*3/uL — AB (ref 150–379)
RBC: 4.48 x10E6/uL (ref 3.77–5.28)
RDW: 13.7 % (ref 12.3–15.4)
WBC: 9.7 10*3/uL (ref 3.4–10.8)

## 2016-11-17 NOTE — Telephone Encounter (Signed)
-----   Message from Lavonia Drafts, MD sent at 11/17/2016 11:08 AM EST ----- Please call pt. She is not anemic. She does not need to take the Integra.  She may take a Womens daily vitamin.   Thx, clh-S

## 2016-11-17 NOTE — Telephone Encounter (Signed)
Patient called and made aware that Hgb 13.7 so she doesn't need to take the Integra iron supplement. We recommend that she start a womens prenatal vitamin. Kathrene Alu RN BSN

## 2016-12-14 ENCOUNTER — Encounter: Payer: Self-pay | Admitting: Podiatry

## 2016-12-14 ENCOUNTER — Ambulatory Visit (INDEPENDENT_AMBULATORY_CARE_PROVIDER_SITE_OTHER): Payer: Medicaid Other | Admitting: Podiatry

## 2016-12-14 DIAGNOSIS — M722 Plantar fascial fibromatosis: Secondary | ICD-10-CM | POA: Diagnosis not present

## 2016-12-14 MED ORDER — TRIAMCINOLONE ACETONIDE 10 MG/ML IJ SUSP
10.0000 mg | Freq: Once | INTRAMUSCULAR | Status: AC
  Administered 2016-12-14: 10 mg

## 2016-12-19 NOTE — Progress Notes (Signed)
Subjective:     Patient ID: [name], female   DOB: 1980/12/26, 36 y.o.   MRN: [id number]  HPI patient states she's having pain in her left heel again   Review of Systems     Objective:   Physical Exam Neurovascular status intact with patient found to have pain in the left plantar heel    Assessment:     Reoccurrence plantar fasciitis    Plan:     We will try 1 more injection which was administered today 3 mg Kenalog 5 mill grams Xylocaine and will be seen back to recheck

## 2017-02-13 ENCOUNTER — Ambulatory Visit: Payer: Self-pay | Admitting: Obstetrics & Gynecology

## 2017-04-06 NOTE — Anesthesia Postprocedure Evaluation (Signed)
Anesthesia Post Note  Patient: [name]  Procedure(s) Performed: Procedure(s) (LRB): MYOMECTOMY (N/A)     Anesthesia Post Evaluation  Last Vitals:  Vitals:   10/07/16 0800 10/07/16 1200  BP: 114/68 103/64  Pulse: 88 95  Resp: 18 18  Temp: 36.9 C 36.7 C    Last Pain:  Vitals:   10/07/16 1200  TempSrc: Oral  PainSc:                  Montez Hageman

## 2017-04-06 NOTE — Addendum Note (Signed)
Addendum  created 04/06/17 1344 by Montez Hageman, MD   Sign clinical note

## 2017-05-24 ENCOUNTER — Ambulatory Visit (INDEPENDENT_AMBULATORY_CARE_PROVIDER_SITE_OTHER): Payer: Medicaid Other | Admitting: Obstetrics & Gynecology

## 2017-05-24 ENCOUNTER — Encounter: Payer: Self-pay | Admitting: Obstetrics & Gynecology

## 2017-05-24 VITALS — BP 127/76 | HR 80 | Ht 65.0 in | Wt 230.0 lb

## 2017-05-24 DIAGNOSIS — N914 Secondary oligomenorrhea: Secondary | ICD-10-CM

## 2017-05-24 NOTE — Progress Notes (Signed)
Patient complaining of increase in frequency of hot flashes. Kathrene Alu RNBSN

## 2017-05-24 NOTE — Progress Notes (Signed)
History:  36 y.o. [id number] here today for eval of hot flushes that are becoming more freq. She is s/p a myomectomy 10/04/2016.  Pt had cycles in Mar, which was heavy, and in June. No menses since June. No monthly cramping. But, with the cycles that she did have there was cramping.   Pt had the IUD prior to surgery for 10 years so is not sure what her 'normal' menstrual pattern would be.  PRIOR to the IUD pt was pregnant.  Pt c/o fatigue. Hgb this am 14.5 at her primary care docs ofc. She has not had a TSH done.   Pt also repors a raised incision that itches.   The following portions of the patient's history were reviewed and updated as appropriate: allergies, current medications, past family history, past medical history, past social history, past surgical history and problem list.  Review of Systems:  Pertinent items are noted in HPI.   Objective:  Physical Exam Blood pressure 127/76, pulse 80, height 5\' 5"  (1.651 m), weight 230 lb (104.3 kg), last menstrual period 03/20/2017.  CONSTITUTIONAL: Well-developed, well-nourished female in no acute distress.  HENT:  Normocephalic, atraumatic EYES: Conjunctivae and EOM are normal. No scleral icterus.  NECK: Normal range of motion SKIN: Skin is warm and dry. No rash noted. Not diaphoretic.No pallor. Jolly: Alert and oriented to person, place, and time. Normal coordination.  Abd: Soft, nontender and nondistended. There is a keloid of her incision.    Assessment & Plan:  Oligomenorrhea with hot flushes after a myomectomy  Labs: FSH and TSH  Keloid of surgical incision  F/u in 2-3 weeks for injection of the incision with Kenalog and lidocaine.  Total face-to-face time with patient was 15 min.  Greater than 50% was spent in counseling and coordination of care with the patient.    Tarini Carrier L. Harraway-Smith, M.D., [name]

## 2017-05-25 LAB — LUTEINIZING HORMONE: LH: 9 m[IU]/mL

## 2017-05-25 LAB — TSH: TSH: 0.438 u[IU]/mL — ABNORMAL LOW (ref 0.450–4.500)

## 2017-05-25 LAB — FOLLICLE STIMULATING HORMONE: FSH: 5.9 m[IU]/mL

## 2017-06-07 LAB — T3: T3 TOTAL: 97 ng/dL (ref 71–180)

## 2017-06-07 LAB — T4: T4, Total: 14.3 ug/dL — ABNORMAL HIGH (ref 4.5–12.0)

## 2017-06-07 LAB — SPECIMEN STATUS REPORT

## 2017-06-12 ENCOUNTER — Telehealth: Payer: Self-pay

## 2017-06-12 NOTE — Telephone Encounter (Signed)
-----   Message from Lavonia Drafts, MD sent at 06/09/2017 11:48 AM EDT ----- Please call pt. Her thyroid tests are abnormal . It appear that she has an overactive thyroid. Please refer her to her primary care provider for eval.  This could effect her bleeding pattern and be the cause of her sx of hot flushes and fatigue.   clh-S

## 2017-06-12 NOTE — Telephone Encounter (Signed)
Patient called and made aware that she has an overactive thyroid. Patient made aware that she will need to be followed by a primary care physician and this could be the cause of her bleeding, hot flushes, and fatigue.  Patient doesn't currently have a PCP but will contact her Medicaid case worker for a list of primary care physicans that are currently accepting medicaid. Kathrene Alu RNBSN

## 2017-06-14 ENCOUNTER — Ambulatory Visit (INDEPENDENT_AMBULATORY_CARE_PROVIDER_SITE_OTHER): Payer: Medicaid Other | Admitting: Obstetrics & Gynecology

## 2017-06-14 ENCOUNTER — Encounter: Payer: Self-pay | Admitting: Obstetrics & Gynecology

## 2017-06-14 VITALS — BP 110/78 | HR 96 | Resp 16 | Ht 65.0 in | Wt 231.0 lb

## 2017-06-14 DIAGNOSIS — L910 Hypertrophic scar: Secondary | ICD-10-CM

## 2017-06-14 NOTE — Progress Notes (Signed)
Pt seen for keloid of transverse incision.  She c/o itching and annoyance of the incision. She requested  steriod injection of the scar. The area was cleaned with alcohol.  3cc of Kenalog 40 + 2 cc of Lidocaine 2% was injected into the keloid.  There were no complications. Pt instructed about s/sx of infection.  She tolerated the procedure well.  She will f/u in 4 weeks for reeval and repeat injection if needed.  Analisia Kingsford L. Harraway-Smith, M.D., [name]

## 2017-07-12 ENCOUNTER — Ambulatory Visit (INDEPENDENT_AMBULATORY_CARE_PROVIDER_SITE_OTHER): Payer: Medicaid Other | Admitting: Obstetrics & Gynecology

## 2017-07-12 ENCOUNTER — Ambulatory Visit: Payer: Self-pay | Admitting: Obstetrics & Gynecology

## 2017-07-12 VITALS — BP 110/70 | HR 75 | Wt 223.0 lb

## 2017-07-12 DIAGNOSIS — L910 Hypertrophic scar: Secondary | ICD-10-CM | POA: Diagnosis not present

## 2017-07-12 NOTE — Progress Notes (Signed)
Pt states incision area is better, still a "little puffy". Pt states that she is having problems with sweating and feeling tired. Pt has not heard anything in regards to referral for thyroid.

## 2017-07-13 ENCOUNTER — Encounter: Payer: Self-pay | Admitting: Obstetrics & Gynecology

## 2017-07-13 NOTE — Progress Notes (Signed)
Pt seen for keloid of transverse surgical incision.  Area cleaned with alcohol.  2cc of Kenalog 40 + 1cc Lidocaine 2% was injected into the keloid.  There were no complications. Pt instructed about s/sx of infection.  She tolerated the procedure well.  She will f/u in 3 weeks for eval.  Brittney Mucha L. Harraway-Smith, M.D., [name]

## 2017-12-30 IMAGING — MR MR PELVIS WO/W CM
5 of 11 series · 19 of 48 positions shown · IV contrast (multihance)
Comparison: None.

ADDENDUM:
Subsequent review shows the presence of an IUD within the
endometrial cavity, which is displaced into the right superior
portion of the uterus by the fibroids described below, largest of
which are located in the lower uterine segment.
CLINICAL DATA: Pelvic mass.  Fibroids.

EXAM:
MRI PELVIS WITHOUT AND WITH CONTRAST
TECHNIQUE: Multiplanar multisequence MR imaging of the pelvis was performed
both before and after administration of intravenous contrast.
CONTRAST:  20mL MULTIHANCE GADOBENATE DIMEGLUMINE 529 MG/ML IV SOLN

[Series 1: (id) loc · axial · 8.0mm · 0.78mm/px · z∈[-26,+200]mm · 3 of 15 slices shown]
[im 1/15]
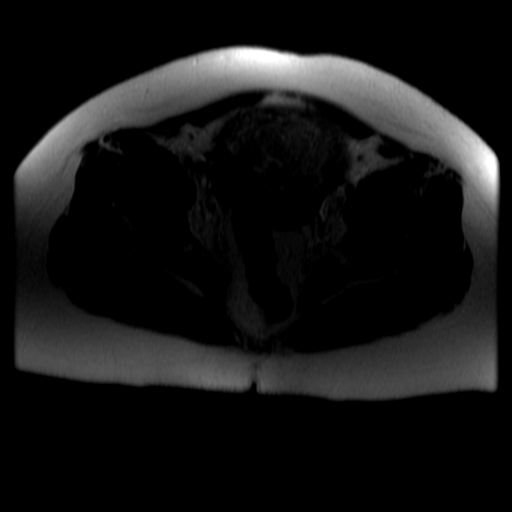
[im 8/15]
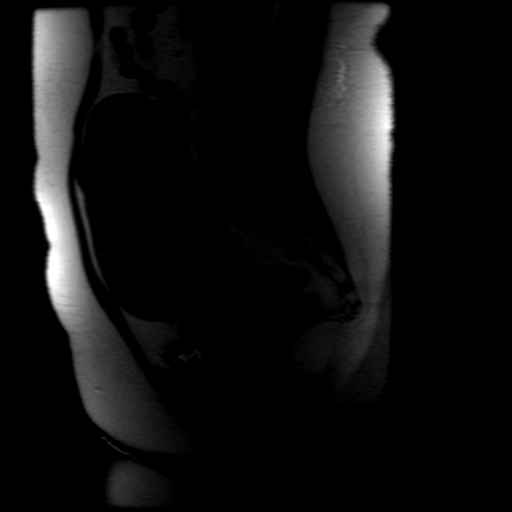
[im 15/15]
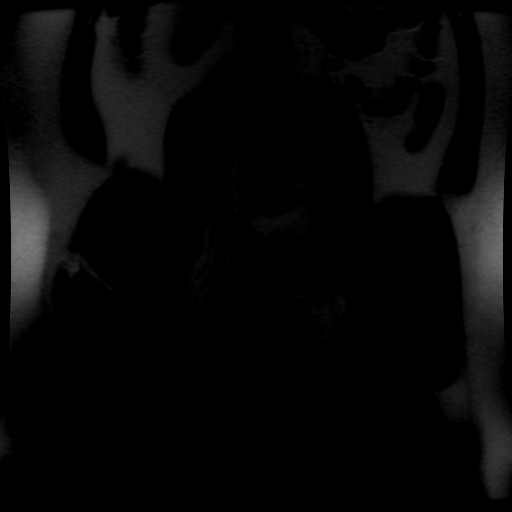

[Series 3: T2 · coronal · 6.0mm · 0.78mm/px · 4 of 32 slices shown (1 of 3)]
[im 1/32]
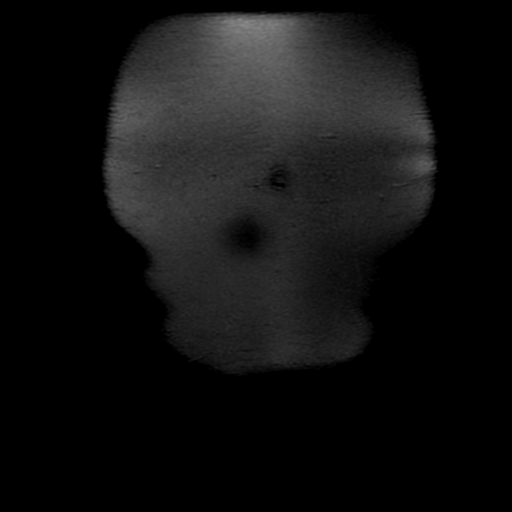
[im 11/32]
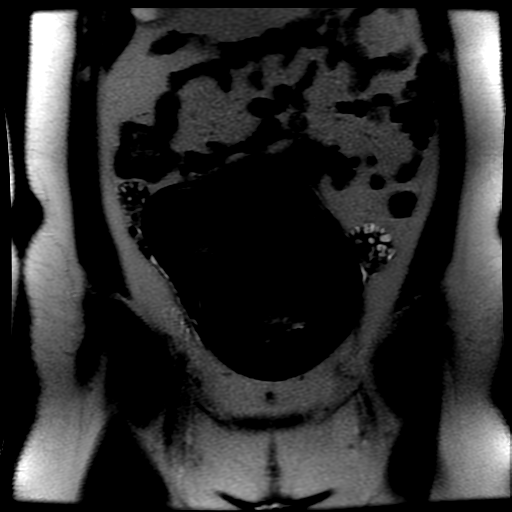
[im 21/32]
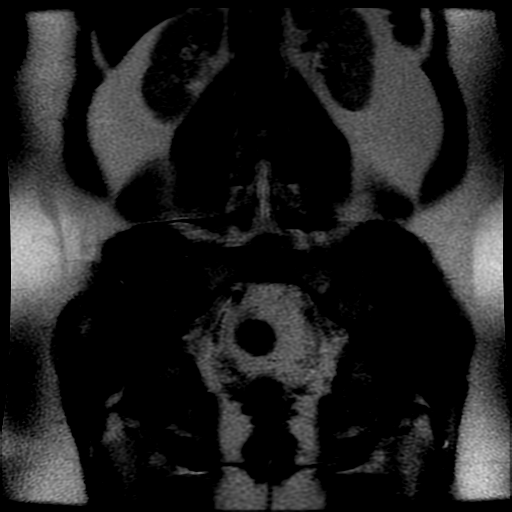
[im 32/32]
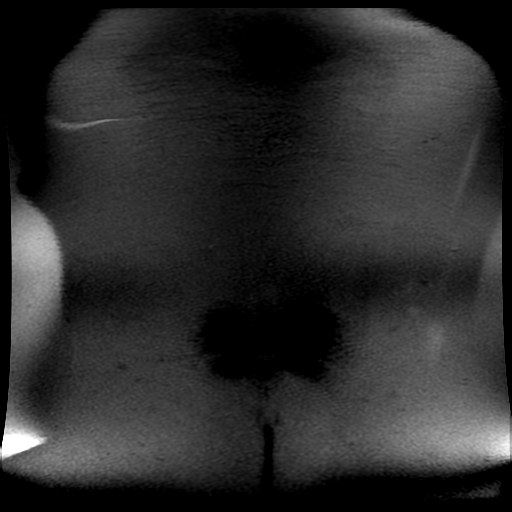

[Series 4: T2 · coronal · 5.0mm · 0.53mm/px · 4 of 39 slices shown (2 of 3)]
[im 1/39]
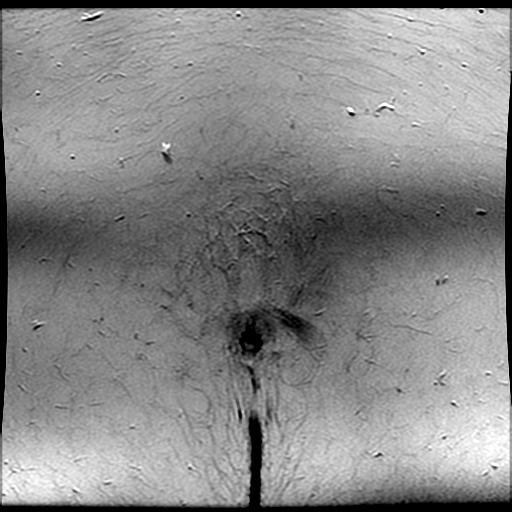
[im 13/39]
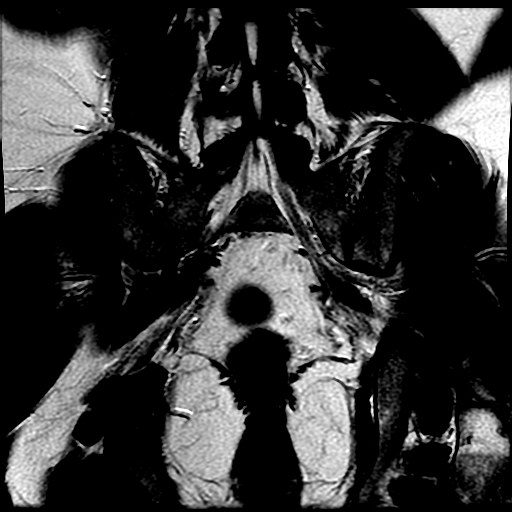
[im 26/39]
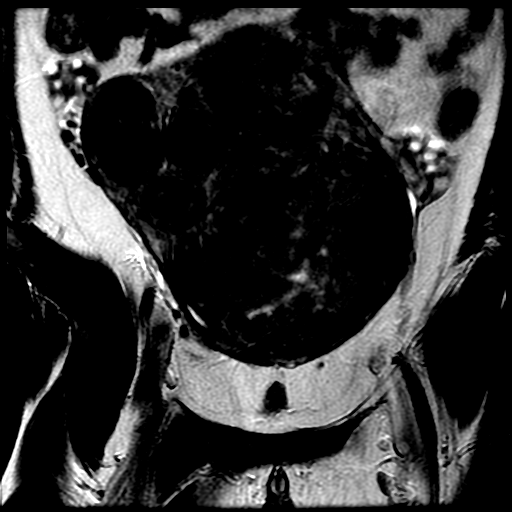
[im 39/39]
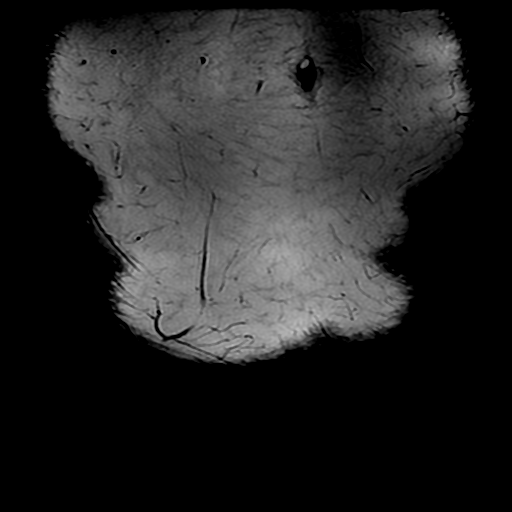

[Series 5: T2 · axial · 5.0mm · 0.51mm/px · z∈[-118,+140]mm · 5 of 44 slices shown (3 of 3)]
[im 1/44]
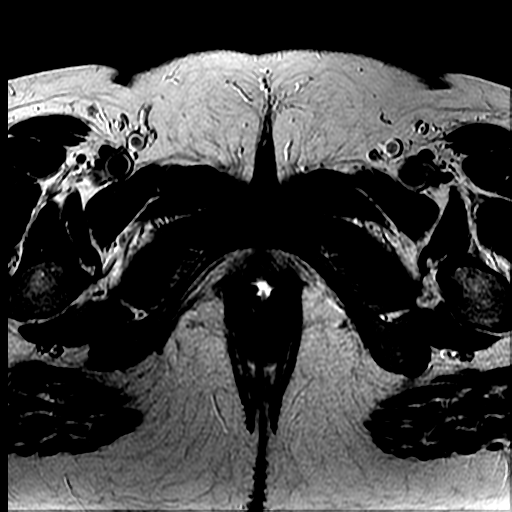
[im 11/44]
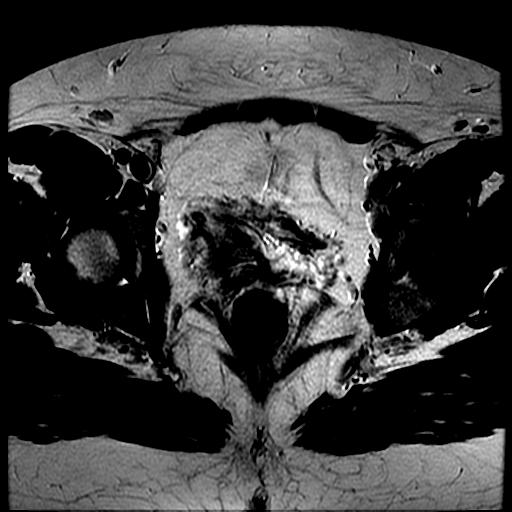
[im 22/44]
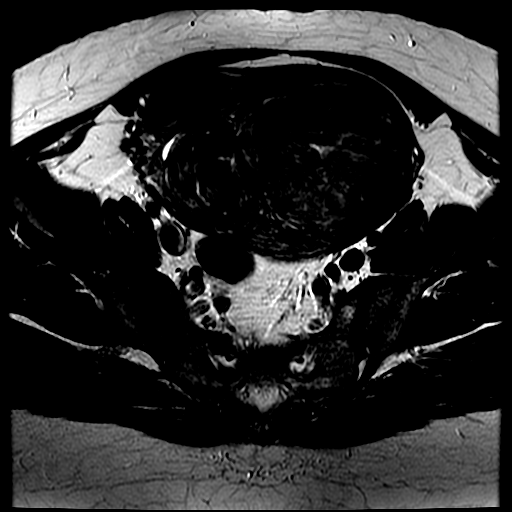
[im 33/44]
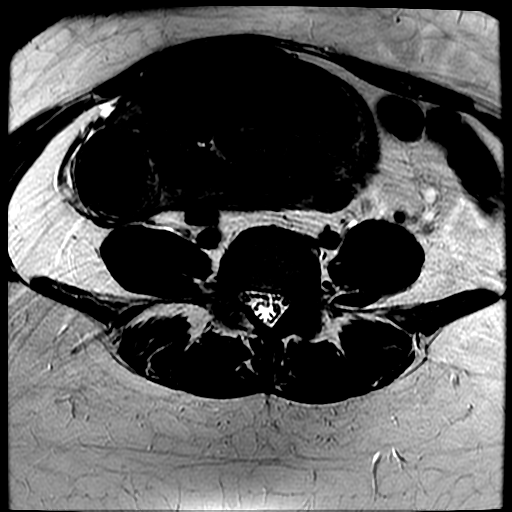
[im 44/44]
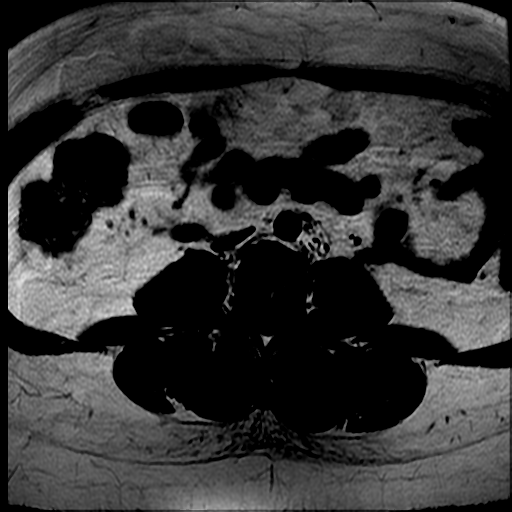

[Series 6: T2 fat-sat · axial · 5.0mm · 0.51mm/px · z∈[-118,+8]mm · 3 of 44 slices shown]
[im 1/44]
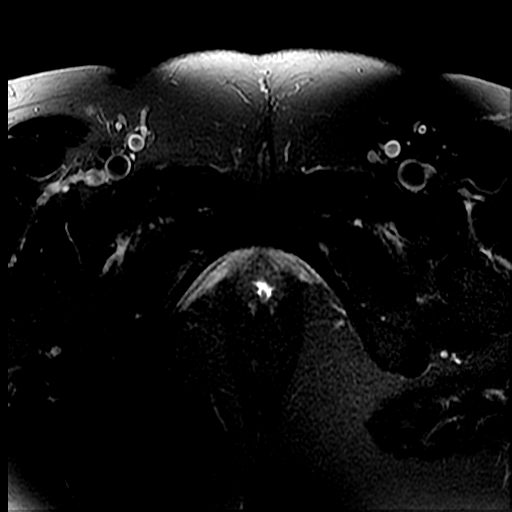
[im 11/44]
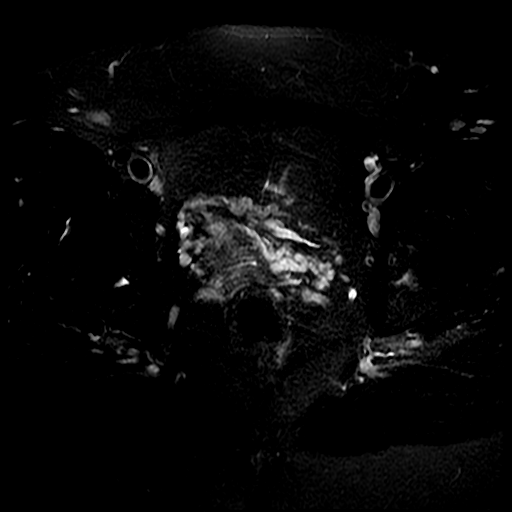
[im 22/44]
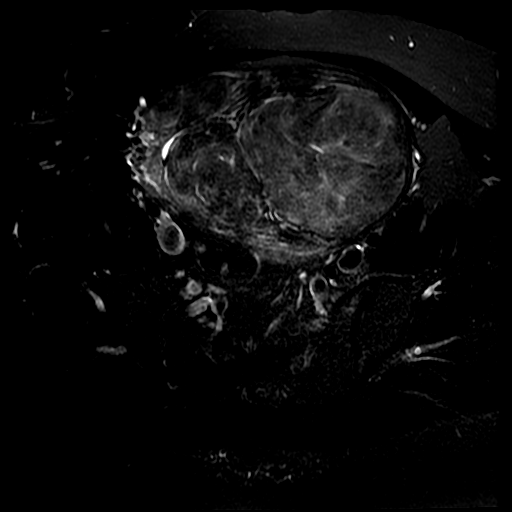

[19 of 48 positions shown; findings below may reference images not displayed]

FINDINGS: Urinary Tract: Unremarkable urinary bladder.

Bowel: Unremarkable pelvic bowel loops.

Vascular/Lymphatic: No pathologically enlarged lymph nodes or other
significant abnormality.

Reproductive:

Uterus: Measures 20.5 x 9.7 x 16.4 cm. Estimated volume = 1909 mL.
Numerous fibroids are seen involving the uterus diffusely which are
submucosal, intramural, and subserosal in location. Single largest
intramural fibroid is located in the right lateral lower uterine
corpus, measuring 10.2 cm in maximum diameter. No pedunculated or
intracavitary fibroids identified. All fibroids show diffuse
contrast enhancement.

No evidence of abnormal endometrial thickening. Cervix and vagina
are unremarkable in appearance.

Right ovary: Appears normal.  No mass identified.

Left ovary:  Appears normal.  No mass identified.

Other: No abnormal free fluid.

Musculoskeletal:  Unremarkable.
IMPRESSION: Markedly enlarged uterus with diffuse involvement by numerous
fibroids.

Normal appearance of both ovaries.  No adnexal masses identified.

## 2018-03-12 ENCOUNTER — Ambulatory Visit: Payer: Self-pay | Admitting: Family Medicine

## 2018-04-04 ENCOUNTER — Other Ambulatory Visit: Payer: Self-pay | Admitting: Obstetrics & Gynecology

## 2018-04-04 ENCOUNTER — Ambulatory Visit (INDEPENDENT_AMBULATORY_CARE_PROVIDER_SITE_OTHER): Payer: Medicaid Other | Admitting: Obstetrics & Gynecology

## 2018-04-04 ENCOUNTER — Other Ambulatory Visit (HOSPITAL_COMMUNITY)
Admission: RE | Admit: 2018-04-04 | Discharge: 2018-04-04 | Disposition: A | Discharge status: Home / Self Care | Payer: Medicaid Other | Source: Ambulatory Visit | Attending: Family Medicine | Admitting: Family Medicine

## 2018-04-04 ENCOUNTER — Encounter: Payer: Self-pay | Admitting: Obstetrics & Gynecology

## 2018-04-04 VITALS — BP 113/80 | HR 93 | Ht 65.0 in | Wt 216.0 lb

## 2018-04-04 DIAGNOSIS — Z30430 Encounter for insertion of intrauterine contraceptive device: Secondary | ICD-10-CM | POA: Diagnosis not present

## 2018-04-04 DIAGNOSIS — N939 Abnormal uterine and vaginal bleeding, unspecified: Secondary | ICD-10-CM

## 2018-04-04 DIAGNOSIS — Z0000 Encounter for general adult medical examination without abnormal findings: Secondary | ICD-10-CM

## 2018-04-04 DIAGNOSIS — R232 Flushing: Secondary | ICD-10-CM

## 2018-04-04 DIAGNOSIS — E782 Mixed hyperlipidemia: Secondary | ICD-10-CM

## 2018-04-04 DIAGNOSIS — Z01419 Encounter for gynecological examination (general) (routine) without abnormal findings: Secondary | ICD-10-CM

## 2018-04-04 MED ORDER — LEVONORGESTREL 19.5 MCG/DAY IU IUD
INTRAUTERINE_SYSTEM | Freq: Once | INTRAUTERINE | Status: AC
  Administered 2018-04-04: 1 via INTRAUTERINE

## 2018-04-04 NOTE — Progress Notes (Signed)
Pt states that she has irregular cycles. Pt states that she bleeds for seven days and then cycle starts again after seven days.

## 2018-04-04 NOTE — Patient Instructions (Signed)
Levonorgestrel intrauterine device (IUD) What is this medicine? LEVONORGESTREL IUD (LEE voe nor jes trel) is a contraceptive (birth control) device. The device is placed inside the uterus by a healthcare professional. It is used to prevent pregnancy. This device can also be used to treat heavy bleeding that occurs during your period. This medicine may be used for other purposes; ask your health care provider or pharmacist if you have questions. COMMON BRAND NAME(S): Kyleena, LILETTA, Mirena, Skyla What should I tell my health care provider before I take this medicine? They need to know if you have any of these conditions: -abnormal Pap smear -cancer of the breast, uterus, or cervix -diabetes -endometritis -genital or pelvic infection now or in the past -have more than one sexual partner or your partner has more than one partner -heart disease -history of an ectopic or tubal pregnancy -immune system problems -IUD in place -liver disease or tumor -problems with blood clots or take blood-thinners -seizures -use intravenous drugs -uterus of unusual shape -vaginal bleeding that has not been explained -an unusual or allergic reaction to levonorgestrel, other hormones, silicone, or polyethylene, medicines, foods, dyes, or preservatives -pregnant or trying to get pregnant -breast-feeding How should I use this medicine? This device is placed inside the uterus by a health care professional. Talk to your pediatrician regarding the use of this medicine in children. Special care may be needed. Overdosage: If you think you have taken too much of this medicine contact a poison control center or emergency room at once. NOTE: This medicine is only for you. Do not share this medicine with others. What if I miss a dose? This does not apply. Depending on the brand of device you have inserted, the device will need to be replaced every 3 to 5 years if you wish to continue using this type of birth  control. What may interact with this medicine? Do not take this medicine with any of the following medications: -amprenavir -bosentan -fosamprenavir This medicine may also interact with the following medications: -aprepitant -armodafinil -barbiturate medicines for inducing sleep or treating seizures -bexarotene -boceprevir -griseofulvin -medicines to treat seizures like carbamazepine, ethotoin, felbamate, oxcarbazepine, phenytoin, topiramate -modafinil -pioglitazone -rifabutin -rifampin -rifapentine -some medicines to treat HIV infection like atazanavir, efavirenz, indinavir, lopinavir, nelfinavir, tipranavir, ritonavir -St. John's wort -warfarin This list may not describe all possible interactions. Give your health care provider a list of all the medicines, herbs, non-prescription drugs, or dietary supplements you use. Also tell them if you smoke, drink alcohol, or use illegal drugs. Some items may interact with your medicine. What should I watch for while using this medicine? Visit your doctor or health care professional for regular check ups. See your doctor if you or your partner has sexual contact with others, becomes HIV positive, or gets a sexual transmitted disease. This product does not protect you against HIV infection (AIDS) or other sexually transmitted diseases. You can check the placement of the IUD yourself by reaching up to the top of your vagina with clean fingers to feel the threads. Do not pull on the threads. It is a good habit to check placement after each menstrual period. Call your doctor right away if you feel more of the IUD than just the threads or if you cannot feel the threads at all. The IUD may come out by itself. You may become pregnant if the device comes out. If you notice that the IUD has come out use a backup birth control method like condoms and call your   health care provider. Using tampons will not change the position of the IUD and are okay to use  during your period. This IUD can be safely scanned with magnetic resonance imaging (MRI) only under specific conditions. Before you have an MRI, tell your healthcare provider that you have an IUD in place, and which type of IUD you have in place. What side effects may I notice from receiving this medicine? Side effects that you should report to your doctor or health care professional as soon as possible: -allergic reactions like skin rash, itching or hives, swelling of the face, lips, or tongue -fever, flu-like symptoms -genital sores -high blood pressure -no menstrual period for 6 weeks during use -pain, swelling, warmth in the leg -pelvic pain or tenderness -severe or sudden headache -signs of pregnancy -stomach cramping -sudden shortness of breath -trouble with balance, talking, or walking -unusual vaginal bleeding, discharge -yellowing of the eyes or skin Side effects that usually do not require medical attention (report to your doctor or health care professional if they continue or are bothersome): -acne -breast pain -change in sex drive or performance -changes in weight -cramping, dizziness, or faintness while the device is being inserted -headache -irregular menstrual bleeding within first 3 to 6 months of use -nausea This list may not describe all possible side effects. Call your doctor for medical advice about side effects. You may report side effects to FDA at 1-800-FDA-1088. Where should I keep my medicine? This does not apply. NOTE: This sheet is a summary. It may not cover all possible information. If you have questions about this medicine, talk to your doctor, pharmacist, or health care provider.  2018 Elsevier/Gold Standard (2016-06-24 14:14:56) Intrauterine Device Insertion, Care After This sheet gives you information about how to care for yourself after your procedure. Your health care provider may also give you more specific instructions. If you have problems or  questions, contact your health care provider. What can I expect after the procedure? After the procedure, it is common to have:  Cramps and pain in the abdomen.  Light bleeding (spotting) or heavier bleeding that is like your menstrual period. This may last for up to a few days.  Lower back pain.  Dizziness.  Headaches.  Nausea.  Follow these instructions at home:  Before resuming sexual activity, check to make sure that you can feel the IUD string(s). You should be able to feel the end of the string(s) below the opening of your cervix. If your IUD string is in place, you may resume sexual activity. ? If you had a hormonal IUD inserted more than 7 days after your most recent period started, you will need to use a backup method of birth control for 7 days after IUD insertion. Ask your health care provider whether this applies to you.  Continue to check that the IUD is still in place by feeling for the string(s) after every menstrual period, or once a month.  Take over-the-counter and prescription medicines only as told by your health care provider.  Do not drive or use heavy machinery while taking prescription pain medicine.  Keep all follow-up visits as told by your health care provider. This is important. Contact a health care provider if:  You have bleeding that is heavier or lasts longer than a normal menstrual cycle.  You have a fever.  You have cramps or abdominal pain that get worse or do not get better with medicine.  You develop abdominal pain that is new or is  not in the same area of earlier cramping and pain.  You feel lightheaded or weak.  You have abnormal or bad-smelling discharge from your vagina.  You have pain during sexual activity.  You have any of the following problems with your IUD string(s): ? The string bothers or hurts you or your sexual partner. ? You cannot feel the string. ? The string has gotten longer.  You can feel the IUD in your  vagina.  You think you may be pregnant, or you miss your menstrual period.  You think you may have an STI (sexually transmitted infection). Get help right away if:  You have flu-like symptoms.  You have a fever and chills.  You can feel that your IUD has slipped out of place. Summary  After the procedure, it is common to have cramps and pain in the abdomen. It is also common to have light bleeding (spotting) or heavier bleeding that is like your menstrual period.  Continue to check that the IUD is still in place by feeling for the string(s) after every menstrual period, or once a month.  Keep all follow-up visits as told by your health care provider. This is important.  Contact your health care provider if you have problems with your IUD string(s), such as the string getting longer or bothering you or your sexual partner. This information is not intended to replace advice given to you by your health care provider. Make sure you discuss any questions you have with your health care provider. Document Released: 05/11/2011 Document Revised: 08/03/2016 Document Reviewed: 08/03/2016 Elsevier Interactive Patient Education  2017 [employer].

## 2018-04-04 NOTE — Progress Notes (Signed)
Subjective:     [name] is a 37 y.o. female here for a routine exam. G2P2 LMP currently. Current complaints: continued AUB. Pt reports spotting of brownish discharge for 1 week after her menses. Pt is not sexually active dn has not been for >5 years.   She reports hot flushes since the surgery She is begin followed by primary care for irreg thyroid hormones. She was told that  She did not need meds. She is scheduled for f/u in the coming week.   Pt prev has an LnIUD and reports good control of bleeding.     Gynecologic History Patient's last menstrual period was 03/29/2018. Contraception: abstinence Last Pap: not recorded. Had prev >2-3 years prev at the [facility] . Results were: normal Last mammogram: n/a.   Obstetric History OB History  Gravida Para Term Preterm AB Living  2 2 2     2   SAB TAB Ectopic Multiple Live Births          2    # Outcome Date GA Lbr Len/2nd Weight Sex Delivery Anes PTL Lv  2 Term 06/08/06 [redacted]w[redacted]d  8 lb 8 oz (3.856 kg) M Vag-Spont   LIV  1 Term 06/16/00 [redacted]w[redacted]d  7 lb 15 oz (3.6 kg) M Vag-Spont   LIV   The following portions of the patient's history were reviewed and updated as appropriate: allergies, current medications, past family history, past medical history, past social history, past surgical history and problem list.  Review of Systems Pertinent items are noted in HPI.    Objective:  BP 113/80   Pulse 93   Ht 5\' 5"  (1.651 m)   Wt 216 lb (98 kg)   LMP 03/29/2018   BMI 35.94 kg/m   General Appearance:    Alert, cooperative, no distress, appears stated age  Head:    Normocephalic, without obvious abnormality, atraumatic  Eyes:    conjunctiva/corneas clear, EOM's intact, both eyes  Ears:    Normal external ear canals, both ears  Nose:   Nares normal, septum midline, mucosa normal, no drainage    or sinus tenderness  Throat:   Lips, mucosa, and tongue normal; teeth and gums normal  Neck:   Supple, symmetrical, trachea midline, no adenopathy;   thyroid:  no enlargement/tenderness/nodules  Back:     Symmetric, no curvature, ROM normal, no CVA tenderness  Lungs:     Clear to auscultation bilaterally, respirations unlabored  Chest Wall:    No tenderness or deformity   Heart:    Regular rate and rhythm, S1 and S2 normal, no murmur, rub   or gallop  Breast Exam:    No tenderness, masses, or nipple abnormality  Abdomen:     Soft, non-tender, bowel sounds active all four quadrants,    no masses, no organomegaly; well healed transverse incision  Genitalia:    Normal female without lesion, discharge or tenderness; uterus enlarged to 10 weeks sized. Mobile; nontender     Extremities:   Extremities normal, atraumatic, no cyanosis or edema  Pulses:   2+ and symmetric all extremities  Skin:   Skin color, texture, turgor normal, no rashes or lesions     GYNECOLOGY CLINIC PROCEDURE NOTE IUD Insertion Procedure Note Patient identified, informed consent performed.  Discussed risks of irregular bleeding, cramping, infection, malpositioning or misplacement of the IUD outside the uterus which may require further procedures. Time out was performed.  Urine pregnancy test negative.  Speculum placed in the vagina.  Cervix visualized.  Cleaned with Betadine x 2.  Grasped anteriorly with a single tooth tenaculum.  Uterus sounded to 7 cm. Liletta IUD placed per manufacturer's recommendations.  Strings trimmed to 3 cm. Tenaculum was removed, good hemostasis noted.  Patient tolerated procedure well.    Assessment:    Healthy female exam.   S/p LnIUD placement today Hot flushes. h/o prev abnormal thyroid screening labs    Plan:    Follow up in: 4 weeks.  for string check F/u PAP with hrHPV Discussed with pt AUB and fibroids Labs: thyroid panel, FSH; lipid panel (for primary care provider) pt fasting Annual in 1 year Patient was given post-procedure instructions.   Joshalyn Ancheta L. Harraway-Smith, M.D., [name]

## 2018-04-05 LAB — TSH: TSH: 2.4 u[IU]/mL (ref 0.450–4.500)

## 2018-04-05 LAB — FOLLICLE STIMULATING HORMONE: FSH: 7.5 m[IU]/mL

## 2018-04-05 LAB — T4, FREE: FREE T4: 1.49 ng/dL (ref 0.82–1.77)

## 2018-04-05 LAB — T3: T3, Total: 119 ng/dL (ref 71–180)

## 2018-04-05 LAB — LIPID PANEL
CHOL/HDL RATIO: 5.8 ratio — AB (ref 0.0–4.4)
CHOLESTEROL TOTAL: 219 mg/dL — AB (ref 100–199)
HDL: 38 mg/dL — ABNORMAL LOW (ref >39)
LDL Calculated: 146 mg/dL — ABNORMAL HIGH (ref 0–99)
Triglycerides: 173 mg/dL — ABNORMAL HIGH (ref 0–149)
VLDL CHOLESTEROL CAL: 35 mg/dL (ref 5–40)

## 2018-04-06 ENCOUNTER — Encounter: Payer: Self-pay | Admitting: Obstetrics & Gynecology

## 2018-04-06 LAB — CYTOLOGY - PAP
Bacterial vaginitis: POSITIVE — AB
Candida vaginitis: NEGATIVE
DIAGNOSIS: NEGATIVE
HPV: NOT DETECTED

## 2018-04-12 ENCOUNTER — Other Ambulatory Visit: Payer: Self-pay | Admitting: Obstetrics & Gynecology

## 2018-04-12 DIAGNOSIS — N760 Acute vaginitis: Principal | ICD-10-CM

## 2018-04-12 DIAGNOSIS — B9689 Other specified bacterial agents as the cause of diseases classified elsewhere: Secondary | ICD-10-CM

## 2018-04-12 MED ORDER — METRONIDAZOLE 500 MG PO TABS: 500.0000 mg | ORAL_TABLET | Freq: Two times a day (BID) | ORAL | 0 refills | Status: DC

## 2018-04-13 ENCOUNTER — Telehealth: Payer: Self-pay

## 2018-04-13 NOTE — Telephone Encounter (Signed)
-----   Message from Lavonia Drafts, MD sent at 04/12/2018  6:38 PM EDT ----- Please call pt and let her know that her thyroid studies are WNL. Her chol and lipids are high and she should f/u with her primary care provider.   Thx, clh-S

## 2018-04-13 NOTE — Telephone Encounter (Signed)
Patient called and made aware of normal thyroid results. Patient also made aware that her cholesterol and lipids were elevated. Patient states she is currently on medication for this and follow by her PCP.  Patient also made aware that her pap smear was normal but did show some bacterial vaginosis. Patient made aware that medication has been sent in for this and she can pick it up at her pharmacy. Kathrene Alu RN

## 2018-04-26 ENCOUNTER — Ambulatory Visit: Payer: Self-pay | Admitting: Obstetrics & Gynecology

## 2018-05-02 ENCOUNTER — Ambulatory Visit (INDEPENDENT_AMBULATORY_CARE_PROVIDER_SITE_OTHER): Payer: Medicaid Other | Admitting: Obstetrics & Gynecology

## 2018-05-02 ENCOUNTER — Encounter: Payer: Self-pay | Admitting: Obstetrics & Gynecology

## 2018-05-02 VITALS — BP 118/82 | HR 100 | Ht 65.0 in | Wt 214.0 lb

## 2018-05-02 DIAGNOSIS — Z30431 Encounter for routine checking of intrauterine contraceptive device: Secondary | ICD-10-CM | POA: Diagnosis not present

## 2018-05-02 NOTE — Patient Instructions (Signed)

## 2018-05-02 NOTE — Progress Notes (Signed)
History:  37 y.o. [id number] here today for today for IUD string check; Mirena IUD was placed 04/04/2018. No complaints about the Mirena, no concerning side effects. She does reports spotting but, no heavy bleeding. Pt reports that she cont to have hot flushes since her myomectomy. She had a normal FSH. She denies assoc sx. She has not discussed this with her primary care provider.      The following portions of the patient's history were reviewed and updated as appropriate: allergies, current medications, past family history, past medical history, past social history, past surgical history and problem list. Last pap smear on 04/04/2018  Review of Systems:  Pertinent items are noted in HPI.   Objective:  Physical Exam Blood pressure 118/82, pulse 100, height 5\' 5"  (1.651 m), weight 214 lb (97.1 kg), last menstrual period 03/30/2018.  CONSTITUTIONAL: Well-developed, well-nourished female in no acute distress.  HENT:  Normocephalic, atraumatic EYES: Conjunctivae and EOM are normal. No scleral icterus.  NECK: Normal range of motion SKIN: Skin is warm and dry. No rash noted. Not diaphoretic.No pallor. [place]: Alert and oriented to person, place, and time. Normal coordination.  Abd: Soft, nontender and nondistended Pelvic: Normal appearing external genitalia; normal appearing vaginal mucosa and cervix.  IUD strings visualized, about 3 cm in length outside cervix.   Assessment & Plan:  Normal IUD check. Patient to keep IUD in place for five years; can come in for removal if she desires pregnancy within the next five years. Routine preventative health maintenance measures emphasized.  Antonea Gaut L. Harraway-Smith, M.D., [name]

## 2020-03-02 ENCOUNTER — Ambulatory Visit: Payer: Medicaid Other | Admitting: Obstetrics & Gynecology

## 2020-03-02 ENCOUNTER — Other Ambulatory Visit: Payer: Self-pay

## 2020-03-02 ENCOUNTER — Encounter: Payer: Self-pay | Admitting: Obstetrics & Gynecology

## 2020-03-02 VITALS — BP 124/81 | HR 80 | Ht 64.75 in | Wt 212.0 lb

## 2020-03-02 DIAGNOSIS — Z30431 Encounter for routine checking of intrauterine contraceptive device: Secondary | ICD-10-CM

## 2020-03-02 DIAGNOSIS — N939 Abnormal uterine and vaginal bleeding, unspecified: Secondary | ICD-10-CM

## 2020-03-02 NOTE — Progress Notes (Signed)
Patient complaining of spotting between periods. Patient has had IUD for two years. LMP 02-01-20, 02-29-20.

## 2020-03-02 NOTE — Progress Notes (Signed)
History:  39 y.o. [id number] here today for eval of irreg menses since her IUD was placed. Pt has a h/o fibroids and menorrhagia. She is s/p a myomectomy 10/04/2016. After myomectomy, pt continued to have irreg cycls and a LnIUD was placed 04/04/2018. Pt reports that she has not been sexually active since that time. She has lost weight. She reports that she still feels tired all the time and has spotting before and after her cycles. She says that she is bleeding today and refuses an exam because that is 'nasty'. She reports monthly cycles with 'chocolate brown' spotting between her cycles. She denies pain.   Pt reported a prior h/o abnormal l PAP but, her last PAP with [facility] was WNL 2019.      The following portions of the patient's history were reviewed and updated as appropriate: allergies, current medications, past family history, past medical history, past social history, past surgical history and problem list.  Review of Systems:  Pertinent items are noted in HPI.    Objective:  Physical Exam Blood pressure 124/81, pulse 80, height 5' 4.75" (1.645 m), weight 212 lb (96.2 kg), last menstrual period 02/29/2020.  CONSTITUTIONAL: Well-developed, well-nourished female in no acute distress.  HENT:  Normocephalic, atraumatic EYES: Conjunctivae and EOM are normal. No scleral icterus.  NECK: Normal range of motion SKIN: Skin is warm and dry. No rash noted. Not diaphoretic.No pallor. [place]: Alert and oriented to person, place, and time. Normal coordination.  Pelvic: pt refused pelvic exam   Assessment & Plan:  AUB- pt thinks its related to her LnIUD and that is possible but, I have explained ot her the DDx for AUB. Pt declines the exam today but, says that she will return when she is not bleeding for an exam. The bleeding is not new for pt. Upon review of her chart, she had irreg cycles with ehr last IUD as well which is why she ultimately ahd the myomectomy.  She also had heavy menses post myomectomy.    She wants to discuss this further. She wondering if she got a 'bad batch' of the IUDs.  Labs: TSH, FSH F/u for PAP with hrHPV and cx when not on menses or sooner prn her comfort.   Total face-to-face time with patient was 20 min.  Greater than 50% was spent in counseling and coordination of care with the patient.   Euphemia Lingerfelt L. Harraway-Smith, M.D., [name]

## 2020-03-03 LAB — CBC
Hematocrit: 35.2 % (ref 34.0–46.6)
Hemoglobin: 11.2 g/dL (ref 11.1–15.9)
MCH: 25.9 pg — ABNORMAL LOW (ref 26.6–33.0)
MCHC: 31.8 g/dL (ref 31.5–35.7)
MCV: 82 fL (ref 79–97)
Platelets: 387 10*3/uL (ref 150–450)
RBC: 4.32 x10E6/uL (ref 3.77–5.28)
RDW: 13.1 % (ref 11.7–15.4)
WBC: 8.3 10*3/uL (ref 3.4–10.8)

## 2020-03-03 LAB — TSH: TSH: 1.99 u[IU]/mL (ref 0.450–4.500)

## 2020-04-20 ENCOUNTER — Ambulatory Visit: Payer: Medicaid Other | Admitting: Obstetrics & Gynecology

## 2020-10-16 ENCOUNTER — Ambulatory Visit: Payer: Medicaid Other | Admitting: Obstetrics & Gynecology

## 2022-10-25 ENCOUNTER — Other Ambulatory Visit: Payer: Self-pay | Admitting: Internal Medicine

## 2022-10-25 DIAGNOSIS — Z1231 Encounter for screening mammogram for malignant neoplasm of breast: Secondary | ICD-10-CM

## 2023-02-28 ENCOUNTER — Ambulatory Visit: Payer: Medicaid Other | Admitting: Physical Therapy

## 2023-03-06 NOTE — Therapy (Unsigned)
OUTPATIENT PHYSICAL THERAPY THORACOLUMBAR EVALUATION   Patient Name: [name] MRN: [id number] DOB:[birth year], 42 y.o., female Today's Date: 03/07/2023  END OF SESSION:  PT End of Session - 03/07/23 1309     Visit Number 1    Date for PT Re-Evaluation 05/16/23    PT Start Time 1215    PT Stop Time 1255    PT Time Calculation (min) 40 min    Activity Tolerance Patient limited by pain    Behavior During Therapy [facility] for tasks assessed/performed             Past Medical History:  Diagnosis Date   Asthma    Carpal tunnel syndrome    Vaginal Pap smear, abnormal    Past Surgical History:  Procedure Laterality Date   COLPOSCOPY     MYOMECTOMY N/A 10/04/2016   Procedure: MYOMECTOMY;  Surgeon: Willodean Rosenthal, MD;  Location: [facility];  Service: Gynecology;  Laterality: N/A;   TONSILLECTOMY     Patient Active Problem List   Diagnosis Date Noted   Fibroid uterus 10/04/2016   Postoperative anemia due to acute blood loss 10/04/2016   Postoperative state 10/04/2016   Enlarged uterus 06/28/2016    PCP: none  REFERRING PROVIDER: Otho Darner, MD  REFERRING DIAG: lumbar strain, rotator cuff syndrome  Rationale for Evaluation and Treatment: Rehabilitation  THERAPY DIAG:  Abnormal posture  Muscle weakness (generalized)  Other lack of coordination  Acute pain of right shoulder  Other low back pain  ONSET DATE: 02/15/23  SUBJECTIVE:                                                                                                                                                                                           SUBJECTIVE STATEMENT: Patient reports accident over 10 years ago with a bulging disc. In more recent years she has noted increased pressure in L hip, stiffness in lower back, R arm weakness, shoulder weakness chronic, increased pain lately, can't lift. Received injection in shoulder which helped, but her posterior shoulder still hurts.    PERTINENT HISTORY:  R CT surgery  PAIN:  Are you having pain? Yes: NPRS scale: 7 back, 8 shoulder/10 Pain location: Low back, L hip Pain description: pressure, numbness Aggravating factors: Sitting for long periods unsupported Relieving factors: Holding her elbow below shoulder height helps avoid pain.  PRECAUTIONS: None  WEIGHT BEARING RESTRICTIONS: No  FALLS:  Has patient fallen in last 6 months? 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 Back can get stiff toward the end of the day. R shoulder pain increased  PLOF: Independent  PATIENT GOALS: Increase strength, ability to lift.  NEXT MD VISIT: About a month  OBJECTIVE:   DIAGNOSTIC FINDINGS:  Had X ray and MRI about 2 months ago, but doesn't recall anything coming from it.  SCREENING FOR RED FLAGS: Bowel or bladder incontinence: No Spinal tumors: No Cauda equina syndrome: No Compression fracture: No Abdominal aneurysm: No  COGNITION: Overall cognitive status: Within functional limits for tasks assessed     SENSATION: WFL Reports H/O T & N extending into R arm  MUSCLE LENGTH: Hamstrings: Right 70 deg; Left 55 deg Thomas test: B hip flexors WNL  POSTURE: increased lumbar lordosis, stands with B knee hyperextension  PALPATION: TTP R UT, Rhomboids, L glut origins, Piriformis  UE TESTING: R shoulder PROM- pain at 110, able to go to 120, Shoulder abd, flex IR, ER limited due to pain in shoulder with any resistance,  Empty can, Hawkin's, lift off test (+), belly press, Yergasson's (-),   LUMBAR ROM:  WNL  LOWER EXTREMITY ROM:  L hip flexion mildly tight, otherwise WNL  LOWER EXTREMITY MMT:    MMT Right eval Left eval  Hip flexion 4 4  Hip extension 4 4-  Hip abduction 4 4-  Hip adduction    Hip internal  rotation    Hip external rotation    Knee flexion 5 5  Knee extension 5 5  Ankle dorsiflexion 5 5  Ankle plantarflexion    Ankle inversion    Ankle eversion     (Blank rows = not tested)  LUMBAR SPECIAL TESTS:  Straight leg raise test: Negative and Slump test: Negative  GAIT: Distance walked: In clinic distances Assistive device utilized: None Level of assistance: Complete Independence Comments: No major gait deviations  TODAY'S TREATMENT:                                                                                                                              DATE:  03/07/23 Education    PATIENT EDUCATION:  Education details: POC Person educated: Patient Education method: Explanation Education comprehension: verbalized understanding  HOME EXERCISE PROGRAM: TBD  ASSESSMENT:  CLINICAL IMPRESSION: Patient is a 42 y.o. who was seen today for physical therapy evaluation and treatment for R rotator cuff syndrome and L low back pain. She works a Engineer, maintenance (IT), has chronic R shoulder weakness and back stiffness, with recent exacerbation in pain and stiffness in both areas. She received an injection in her R shoulder which did seem to help some with the pain, but still present. She demonstrates pain and weakness in both areas upon exam, with positive impingement in shoulder and L glut and piriformis spasm note. She will benefit from PT to calm the acute exacerbation of her pain as well as to facilitate improved strength and ROM in shoulders and LE,  as well as improved trunk stability in order to prevent re-ocurrence of her pain.  OBJECTIVE IMPAIRMENTS: Abnormal gait, decreased activity tolerance, decreased balance, decreased endurance, decreased mobility, difficulty walking, decreased ROM, decreased strength, increased muscle spasms, impaired flexibility, improper body mechanics, postural dysfunction, and pain.   ACTIVITY LIMITATIONS: carrying, lifting, bending, and locomotion  level  PARTICIPATION LIMITATIONS: meal prep, cleaning, laundry, community activity, and occupation  PERSONAL FACTORS: Past/current experiences are also affecting patient's functional outcome.   REHAB POTENTIAL: Good  CLINICAL DECISION MAKING: Stable/uncomplicated  EVALUATION COMPLEXITY: Low   GOALS: Goals reviewed with patient? Yes  SHORT TERM GOALS: Target date: 03/20/23  I with initial HEP Baseline: Goal status: INITIAL  LONG TERM GOALS: Target date: 05/16/23  I with final HEP Baseline:  Goal status: INITIAL  2.  Patient will recover R shoulder ROM and strength = L, with pain < 3/10 Baseline: Pain with any resisted motion, pain with A or PROM > 100 Goal status: INITIAL  3.  Increase L hip strength to at least 4+/5 Baseline: 4-/5 Goal status: INITIAL  4.  Patient will be able to lift 10# from floor and place at shoulder height without reports of LBP or R shoulder pain. Baseline: Avoiding lifting due to pain Goal status: INITIAL  5.  Patient will report LBP of < 3/10 after performing all normal daily activities. Baseline: 7 Goal status: INITIAL  6.  Patient will report R shoulder pain of < 3/10 after completing all normal daily activities. Baseline: 8 Goal status: INITIAL  PLAN:  PT FREQUENCY: 1-2x/week  PT DURATION: 10 weeks  PLANNED INTERVENTIONS: Therapeutic exercises, Therapeutic activity, Neuromuscular re-education, Balance training, Gait training, Patient/Family education, Self Care, Joint mobilization, Dry Needling, Electrical stimulation, Spinal mobilization, Cryotherapy, Moist heat, Vasopneumatic device, Ultrasound, Ionotophoresis 4mg /ml Dexamethasone, and Manual therapy.  PLAN FOR NEXT SESSION: HEP for shoulder stability, lower body strength   Iona Beard, DPT 03/07/2023, 2:10 PM

## 2023-03-07 ENCOUNTER — Encounter: Payer: Self-pay | Admitting: Physical Therapy

## 2023-03-07 ENCOUNTER — Ambulatory Visit: Payer: Medicaid Other | Attending: Family Medicine | Admitting: Physical Therapy

## 2023-03-07 DIAGNOSIS — M6281 Muscle weakness (generalized): Secondary | ICD-10-CM | POA: Insufficient documentation

## 2023-03-07 DIAGNOSIS — R293 Abnormal posture: Secondary | ICD-10-CM | POA: Insufficient documentation

## 2023-03-07 DIAGNOSIS — M25511 Pain in right shoulder: Secondary | ICD-10-CM | POA: Insufficient documentation

## 2023-03-07 DIAGNOSIS — R278 Other lack of coordination: Secondary | ICD-10-CM | POA: Diagnosis present

## 2023-03-07 DIAGNOSIS — M5459 Other low back pain: Secondary | ICD-10-CM | POA: Insufficient documentation

## 2023-03-14 ENCOUNTER — Ambulatory Visit: Payer: Medicaid Other | Admitting: Physical Therapy

## 2023-03-14 DIAGNOSIS — M25511 Pain in right shoulder: Secondary | ICD-10-CM

## 2023-03-14 DIAGNOSIS — M6281 Muscle weakness (generalized): Secondary | ICD-10-CM

## 2023-03-14 DIAGNOSIS — M5459 Other low back pain: Secondary | ICD-10-CM

## 2023-03-14 DIAGNOSIS — R293 Abnormal posture: Secondary | ICD-10-CM | POA: Diagnosis not present

## 2023-03-14 NOTE — Therapy (Signed)
OUTPATIENT PHYSICAL THERAPY THORACOLUMBAR    Patient Name: [name] MRN: [id number] DOB:1981-09-26, 42 y.o., female Today's Date: 03/14/2023  END OF SESSION:  PT End of Session - 03/14/23 1018     Visit Number 2    Date for PT Re-Evaluation 05/16/23    PT Start Time 1015    PT Stop Time 1100    PT Time Calculation (min) 45 min             Past Medical History:  Diagnosis Date   Asthma    Carpal tunnel syndrome    Vaginal Pap smear, abnormal    Past Surgical History:  Procedure Laterality Date   COLPOSCOPY     MYOMECTOMY N/A 10/04/2016   Procedure: MYOMECTOMY;  Surgeon: Willodean Rosenthal, MD;  Location: [facility];  Service: Gynecology;  Laterality: N/A;   TONSILLECTOMY     Patient Active Problem List   Diagnosis Date Noted   Fibroid uterus 10/04/2016   Postoperative anemia due to acute blood loss 10/04/2016   Postoperative state 10/04/2016   Enlarged uterus 06/28/2016    PCP: none  REFERRING PROVIDER: Otho Darner, MD  REFERRING DIAG: lumbar strain, rotator cuff syndrome  Rationale for Evaluation and Treatment: Rehabilitation  THERAPY DIAG:  Abnormal posture  Muscle weakness (generalized)  Acute pain of right shoulder  Other low back pain  ONSET DATE: 02/15/23  SUBJECTIVE:                                                                                                                                                                                           SUBJECTIVE STATEMENT: Doing better than last week, trying to move arm more PERTINENT HISTORY:  R CT surgery  PAIN:  Are you having pain? Yes: NPRS scale: 5 back, 5 shoulder/10 Pain location: Low back, L hip Pain description: pressure, numbness Aggravating factors: Sitting for long periods unsupported Relieving factors: Holding her elbow below shoulder height helps avoid pain.  PRECAUTIONS: None  WEIGHT BEARING RESTRICTIONS: No  FALLS:  Has patient fallen in last 6 months?  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 Back can get stiff toward the end of the day. R shoulder pain increased  PLOF: Independent  PATIENT GOALS: Increase strength, ability to lift.  NEXT MD VISIT: About a month  OBJECTIVE:   DIAGNOSTIC FINDINGS:  Had X ray and MRI about 2 months ago, but doesn't recall anything coming from it.  SCREENING FOR  RED FLAGS: Bowel or bladder incontinence: No Spinal tumors: No Cauda equina syndrome: No Compression fracture: No Abdominal aneurysm: No  COGNITION: Overall cognitive status: Within functional limits for tasks assessed     SENSATION: WFL Reports H/O T & N extending into R arm  MUSCLE LENGTH: Hamstrings: Right 70 deg; Left 55 deg Thomas test: B hip flexors WNL  POSTURE: increased lumbar lordosis, stands with B knee hyperextension  PALPATION: TTP R UT, Rhomboids, L glut origins, Piriformis  UE TESTING: R shoulder PROM- pain at 110, able to go to 120, Shoulder abd, flex IR, ER limited due to pain in shoulder with any resistance,  Empty can, Hawkin's, lift off test (+), belly press, Yergasson's (-),   LUMBAR ROM:  WNL  LOWER EXTREMITY ROM:  L hip flexion mildly tight, otherwise WNL  LOWER EXTREMITY MMT:    MMT Right eval Left eval  Hip flexion 4 4  Hip extension 4 4-  Hip abduction 4 4-  Hip adduction    Hip internal rotation    Hip external rotation    Knee flexion 5 5  Knee extension 5 5  Ankle dorsiflexion 5 5  Ankle plantarflexion    Ankle inversion    Ankle eversion     (Blank rows = not tested)  LUMBAR SPECIAL TESTS:  Straight leg raise test: Negative and Slump test: Negative  GAIT: Distance walked: In clinic distances Assistive device utilized: None Level of assistance: Complete Independence Comments: No major  gait deviations  TODAY'S TREATMENT:                                                                                                                              DATE:   03/14/23  Dyna disc pelvic ROM 4 way 10 each Red tband scap stab on dyna disc Nustep L 5 Ppt 10x hold 3 sec Bridge 10 x hold 3 sec Bridge with resisted hip flex and clams GTband Feet on ball bridge,KTC and obl Iso abdominals with ball  ( upper and lower) PROM LE and trunk STS with wt ball 10 x press, 10 x [facility]    03/07/23 Education    PATIENT EDUCATION:  Education details: POC Person educated: Patient Education method: Explanation Education comprehension: verbalized understanding  HOME EXERCISE PROGRAM:  Access Code: [email or web address] URL: [email or web address] Date: 03/14/2023 Prepared by: Betti Goodenow  Exercises - Shoulder extension with resistance - Neutral  - 1 x daily - 7 x weekly - 2 sets - 10 reps - Standing Shoulder Row with Anchored Resistance  - 1 x daily - 7 x weekly - 2 sets - 10 reps - Shoulder Internal Rotation with Resistance  - 1 x daily - 7 x weekly - 2 sets - 10 reps - Standing Shoulder External Rotation with Resistance  - 1 x daily - 7 x weekly - 2 sets - 10 reps - Standing Shoulder Horizontal Abduction with Resistance  - 1 x daily - 7  x weekly - 2 sets - 10 reps - Supine Bridge  - 1 x daily - 7 x weekly - 2 sets - 10 reps - Supine March  - 1 x daily - 7 x weekly - 2 sets - 10 reps  ASSESSMENT:  CLINICAL IMPRESSION: pt arrives with less pain and states moving arm more. Pt tolerated initial exercise progression well with cuing and focus on est and issuing HEP. STG met OBJECTIVE IMPAIRMENTS: Abnormal gait, decreased activity tolerance, decreased balance, decreased endurance, decreased mobility, difficulty walking, decreased ROM, decreased strength, increased muscle spasms, impaired flexibility, improper body mechanics, postural dysfunction, and pain.   ACTIVITY LIMITATIONS:  carrying, lifting, bending, and locomotion level  PARTICIPATION LIMITATIONS: meal prep, cleaning, laundry, community activity, and occupation  PERSONAL FACTORS: Past/current experiences are also affecting patient's functional outcome.   REHAB POTENTIAL: Good  CLINICAL DECISION MAKING: Stable/uncomplicated  EVALUATION COMPLEXITY: Low   GOALS: Goals reviewed with patient? Yes  SHORT TERM GOALS: Target date: 03/20/23  I with initial HEP Baseline: Goal status: 03/14/23 MET  LONG TERM GOALS: Target date: 05/16/23  I with final HEP Baseline:  Goal status: INITIAL  2.  Patient will recover R shoulder ROM and strength = L, with pain < 3/10 Baseline: Pain with any resisted motion, pain with A or PROM > 100 Goal status: INITIAL  3.  Increase L hip strength to at least 4+/5 Baseline: 4-/5 Goal status: INITIAL  4.  Patient will be able to lift 10# from floor and place at shoulder height without reports of LBP or R shoulder pain. Baseline: Avoiding lifting due to pain Goal status: INITIAL  5.  Patient will report LBP of < 3/10 after performing all normal daily activities. Baseline: 7 Goal status: INITIAL  6.  Patient will report R shoulder pain of < 3/10 after completing all normal daily activities. Baseline: 8 Goal status: INITIAL  PLAN:  PT FREQUENCY: 1-2x/week  PT DURATION: 10 weeks  PLANNED INTERVENTIONS: Therapeutic exercises, Therapeutic activity, Neuromuscular re-education, Balance training, Gait training, Patient/Family education, Self Care, Joint mobilization, Dry Needling, Electrical stimulation, Spinal mobilization, Cryotherapy, Moist heat, Vasopneumatic device, Ultrasound, Ionotophoresis 4mg /ml Dexamethasone, and Manual therapy.  PLAN FOR NEXT SESSION: assess and progress    Patient Details  Name: [name] MRN: [id number] Date of Birth: 1981-02-24 Referring Provider:  Otho Darner, MD  Encounter Date: 03/14/2023   Suanne Marker,  PTA 03/14/2023, 10:19 AM  [facility] [address] [place], [place], [address] Phone: [phone]   Fax:  [phone]

## 2023-03-16 ENCOUNTER — Ambulatory Visit: Payer: Medicaid Other | Admitting: Physical Therapy

## 2023-03-21 ENCOUNTER — Ambulatory Visit: Payer: Medicaid Other | Admitting: Physical Therapy

## 2023-03-21 DIAGNOSIS — R293 Abnormal posture: Secondary | ICD-10-CM | POA: Diagnosis not present

## 2023-03-21 DIAGNOSIS — M25511 Pain in right shoulder: Secondary | ICD-10-CM

## 2023-03-21 DIAGNOSIS — M6281 Muscle weakness (generalized): Secondary | ICD-10-CM

## 2023-03-21 DIAGNOSIS — M5459 Other low back pain: Secondary | ICD-10-CM

## 2023-03-21 NOTE — Therapy (Signed)
OUTPATIENT PHYSICAL THERAPY THORACOLUMBAR    Patient Name: [name] MRN: [id number] DOB:[birth year], 42 y.o., female Today's Date: 03/21/2023  END OF SESSION:  PT End of Session - 03/21/23 1043     Visit Number 3    Date for PT Re-Evaluation 05/16/23    PT Start Time 1045    PT Stop Time 1130    PT Time Calculation (min) 45 min             Past Medical History:  Diagnosis Date   Asthma    Carpal tunnel syndrome    Vaginal Pap smear, abnormal    Past Surgical History:  Procedure Laterality Date   COLPOSCOPY     MYOMECTOMY N/A 10/04/2016   Procedure: MYOMECTOMY;  Surgeon: Willodean Rosenthal, MD;  Location: [facility];  Service: Gynecology;  Laterality: N/A;   TONSILLECTOMY     Patient Active Problem List   Diagnosis Date Noted   Fibroid uterus 10/04/2016   Postoperative anemia due to acute blood loss 10/04/2016   Postoperative state 10/04/2016   Enlarged uterus 06/28/2016    PCP: none  REFERRING PROVIDER: Otho Darner, MD  REFERRING DIAG: lumbar strain, rotator cuff syndrome  Rationale for Evaluation and Treatment: Rehabilitation  THERAPY DIAG:  Abnormal posture  Muscle weakness (generalized)  Acute pain of right shoulder  Other low back pain  ONSET DATE: 02/15/23  SUBJECTIVE:                                                                                                                                                                                           SUBJECTIVE STATEMENT: Doing HEP.if I over do my shld muscles in upper arm reall yhurt. PERTINENT HISTORY:  R CT surgery  PAIN:  Are you having pain? Yes: NPRS scale: 4 back, 7shoulder/10 Pain location: Low back, L hip Pain description: pressure, numbness Aggravating factors: Sitting for long periods unsupported Relieving factors: Holding her elbow below shoulder height helps avoid pain.  PRECAUTIONS: None  WEIGHT BEARING RESTRICTIONS: No  FALLS:  Has patient fallen in last 6  months? 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 Back can get stiff toward the end of the day. R shoulder pain increased  PLOF: Independent  PATIENT GOALS: Increase strength, ability to lift.  NEXT MD VISIT: About a month  OBJECTIVE:   DIAGNOSTIC FINDINGS:  Had X ray and MRI about 2 months ago, but doesn't recall anything coming from it.  SCREENING FOR RED FLAGS: Bowel or bladder incontinence: No Spinal tumors: No Cauda equina syndrome: No Compression fracture: No Abdominal aneurysm: No  COGNITION: Overall cognitive status: Within functional limits for tasks assessed     SENSATION: WFL Reports H/O T & N extending into R arm  MUSCLE LENGTH: Hamstrings: Right 70 deg; Left 55 deg Thomas test: B hip flexors WNL  POSTURE: increased lumbar lordosis, stands with B knee hyperextension  PALPATION: TTP R UT, Rhomboids, L glut origins, Piriformis  UE TESTING: R shoulder PROM- pain at 110, able to go to 120, Shoulder abd, flex IR, ER limited due to pain in shoulder with any resistance,  Empty can, Hawkin's, lift off test (+), belly press, Yergasson's (-),   LUMBAR ROM:  WNL  LOWER EXTREMITY ROM:  L hip flexion mildly tight, otherwise WNL  LOWER EXTREMITY MMT:    MMT Right eval Left eval  Hip flexion 4 4  Hip extension 4 4-  Hip abduction 4 4-  Hip adduction    Hip internal rotation    Hip external rotation    Knee flexion 5 5  Knee extension 5 5  Ankle dorsiflexion 5 5  Ankle plantarflexion    Ankle inversion    Ankle eversion     (Blank rows = not tested)  LUMBAR SPECIAL TESTS:  Straight leg raise test: Negative and Slump test: Negative  GAIT: Distance walked: In clinic distances Assistive device utilized: None Level of assistance: Complete Independence Comments:  No major gait deviations  TODAY'S TREATMENT:                                                                                                                              DATE:   03/21/23 Nustep L 5 Cable pulleys shld ext 10# and row 15# 2 sets 10 Lat pull down 20# 2 sets 10 Trunk ext black tband 2 sets 10 Red tband 3 way stab Ball vs wall 5 x CW and 5 x CCW STS wt ball 10 x with chest press,10 x OH press PROM RT shld, STW RT lateral shld and upper arm muscles Feet on ball bridge, obl and iso abs Ionto RT lateal shld 1.2 cc 4 hour patch  03/14/23  Dyna disc pelvic ROM 4 way 10 each Red tband scap stab on dyna disc Nustep L 5 Ppt 10x hold 3 sec Bridge 10 x hold 3 sec Bridge with resisted hip flex and clams GTband Feet on ball bridge,KTC and obl Iso abdominals with ball  ( upper and lower) PROM LE and trunk STS with wt ball 10 x press, 10 x [facility]    03/07/23 Education    PATIENT EDUCATION:  Education details: POC Person educated: Patient Education method: Explanation Education comprehension: verbalized understanding  HOME EXERCISE PROGRAM:  Access Code: [email or web address] URL: [email or web address] Date: 03/14/2023 Prepared by: Kirstin Kugler  Exercises - Shoulder extension with resistance - Neutral  - 1 x daily - 7  x weekly - 2 sets - 10 reps - Standing Shoulder Row with Anchored Resistance  - 1 x daily - 7 x weekly - 2 sets - 10 reps - Shoulder Internal Rotation with Resistance  - 1 x daily - 7 x weekly - 2 sets - 10 reps - Standing Shoulder External Rotation with Resistance  - 1 x daily - 7 x weekly - 2 sets - 10 reps - Standing Shoulder Horizontal Abduction with Resistance  - 1 x daily - 7 x weekly - 2 sets - 10 reps - Supine Bridge  - 1 x daily - 7 x weekly - 2 sets - 10 reps - Supine March  - 1 x daily - 7 x weekly - 2 sets - 10 reps  ASSESSMENT:  CLINICAL IMPRESSION: pt arrived with c/o increased shld pain esp after increased actvity, tenderness  in RT upper arm esp deltoid area, PROM good with exception on IR -needs cues to relax and adjust as she has a catch. Trial of ionto or pain. Progressed ex and instructed to continue with HEP OBJECTIVE IMPAIRMENTS: Abnormal gait, decreased activity tolerance, decreased balance, decreased endurance, decreased mobility, difficulty walking, decreased ROM, decreased strength, increased muscle spasms, impaired flexibility, improper body mechanics, postural dysfunction, and pain.   ACTIVITY LIMITATIONS: carrying, lifting, bending, and locomotion level  PARTICIPATION LIMITATIONS: meal prep, cleaning, laundry, community activity, and occupation  PERSONAL FACTORS: Past/current experiences are also affecting patient's functional outcome.   REHAB POTENTIAL: Good  CLINICAL DECISION MAKING: Stable/uncomplicated  EVALUATION COMPLEXITY: Low   GOALS: Goals reviewed with patient? Yes  SHORT TERM GOALS: Target date: 03/20/23  I with initial HEP Baseline: Goal status: 03/14/23 MET  LONG TERM GOALS: Target date: 05/16/23  I with final HEP Baseline:  Goal status: INITIAL  2.  Patient will recover R shoulder ROM and strength = L, with pain < 3/10 Baseline: Pain with any resisted motion, pain with A or PROM > 100 Goal status: INITIAL  3.  Increase L hip strength to at least 4+/5 Baseline: 4-/5 Goal status: INITIAL  4.  Patient will be able to lift 10# from floor and place at shoulder height without reports of LBP or R shoulder pain. Baseline: Avoiding lifting due to pain Goal status: INITIAL  5.  Patient will report LBP of < 3/10 after performing all normal daily activities. Baseline: 7 Goal status: INITIAL  6.  Patient will report R shoulder pain of < 3/10 after completing all normal daily activities. Baseline: 8 Goal status: INITIAL  PLAN:  PT FREQUENCY: 1-2x/week  PT DURATION: 10 weeks  PLANNED INTERVENTIONS: Therapeutic exercises, Therapeutic activity, Neuromuscular re-education,  Balance training, Gait training, Patient/Family education, Self Care, Joint mobilization, Dry Needling, Electrical stimulation, Spinal mobilization, Cryotherapy, Moist heat, Vasopneumatic device, Ultrasound, Ionotophoresis 4mg /ml Dexamethasone, and Manual therapy.  PLAN FOR NEXT SESSION: assess and progress    Patient Details  Name: [name] MRN: [id number] Date of Birth: [birth year] Referring Provider:  Otho Darner, MD  Encounter Date: 03/21/2023   Suanne Marker, PTA 03/21/2023, 10:44 AM  [place] [facility] [address] [place], [place], [address] Phone: [phone]   Fax:  [phone] at [facility] [address] [place], [place], [address] Phone: [phone]   Fax:  [phone]  Patient Details  Name: [name] MRN: [id number] Date of Birth: [birth year] Referring Provider:  Otho Darner, MD  Encounter Date: 03/21/2023   Suanne Marker, PTA  03/21/2023, 10:44 AM  [place] [facility] at [facility] [address] [place], [place], [address] Phone: [phone]   Fax:  [phone]

## 2023-03-28 ENCOUNTER — Ambulatory Visit: Payer: Medicaid Other | Attending: Family Medicine | Admitting: Physical Therapy

## 2023-03-28 DIAGNOSIS — M6281 Muscle weakness (generalized): Secondary | ICD-10-CM | POA: Insufficient documentation

## 2023-03-28 DIAGNOSIS — M25511 Pain in right shoulder: Secondary | ICD-10-CM | POA: Insufficient documentation

## 2023-03-28 DIAGNOSIS — M5459 Other low back pain: Secondary | ICD-10-CM | POA: Insufficient documentation

## 2023-03-28 DIAGNOSIS — R293 Abnormal posture: Secondary | ICD-10-CM | POA: Diagnosis present

## 2023-03-28 DIAGNOSIS — R278 Other lack of coordination: Secondary | ICD-10-CM | POA: Insufficient documentation

## 2023-03-28 NOTE — Therapy (Signed)
OUTPATIENT PHYSICAL THERAPY THORACOLUMBAR    Patient Name: [name] MRN: [id number] DOB:06/30/1981, 42 y.o., female Today's Date: 03/28/2023  END OF SESSION:  PT End of Session - 03/28/23 0927     Visit Number 4    Date for PT Re-Evaluation 05/16/23    PT Start Time 0930    PT Stop Time 1015    PT Time Calculation (min) 45 min             Past Medical History:  Diagnosis Date   Asthma    Carpal tunnel syndrome    Vaginal Pap smear, abnormal    Past Surgical History:  Procedure Laterality Date   COLPOSCOPY     MYOMECTOMY N/A 10/04/2016   Procedure: MYOMECTOMY;  Surgeon: Willodean Rosenthal, MD;  Location: [facility];  Service: Gynecology;  Laterality: N/A;   TONSILLECTOMY     Patient Active Problem List   Diagnosis Date Noted   Fibroid uterus 10/04/2016   Postoperative anemia due to acute blood loss 10/04/2016   Postoperative state 10/04/2016   Enlarged uterus 06/28/2016    PCP: none  REFERRING PROVIDER: Otho Darner, MD  REFERRING DIAG: lumbar strain, rotator cuff syndrome  Rationale for Evaluation and Treatment: Rehabilitation  THERAPY DIAG:  Abnormal posture  Muscle weakness (generalized)  Acute pain of right shoulder  Other low back pain  ONSET DATE: 02/15/23  SUBJECTIVE:                                                                                                                                                                                           SUBJECTIVE STATEMENT: NE with patch, shld still bad. Back is okay PERTINENT HISTORY:  R CT surgery  PAIN:  Are you having pain? Yes: NPRS scale: 4 back, 7shoulder/10 Pain location: Low back, L hip Pain description: pressure, numbness Aggravating factors: Sitting for long periods unsupported Relieving factors: Holding her elbow below shoulder height helps avoid pain.  PRECAUTIONS: None  WEIGHT BEARING RESTRICTIONS: No  FALLS:  Has patient fallen in last 6 months? 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 Back can get stiff toward the end of the day. R shoulder pain increased  PLOF: Independent  PATIENT GOALS: Increase strength, ability to lift.  NEXT MD VISIT: About a month  OBJECTIVE:   DIAGNOSTIC FINDINGS:  Had X ray and MRI about 2 months ago, but doesn't recall anything coming from it.  SCREENING FOR RED FLAGS:  Bowel or bladder incontinence: No Spinal tumors: No Cauda equina syndrome: No Compression fracture: No Abdominal aneurysm: No  COGNITION: Overall cognitive status: Within functional limits for tasks assessed     SENSATION: WFL Reports H/O T & N extending into R arm  MUSCLE LENGTH: Hamstrings: Right 70 deg; Left 55 deg Thomas test: B hip flexors WNL  POSTURE: increased lumbar lordosis, stands with B knee hyperextension  PALPATION: TTP R UT, Rhomboids, L glut origins, Piriformis  UE TESTING: R shoulder PROM- pain at 110, able to go to 120, Shoulder abd, flex IR, ER limited due to pain in shoulder with any resistance,  Empty can, Hawkin's, lift off test (+), belly press, Yergasson's (-),   LUMBAR ROM:  WNL  LOWER EXTREMITY ROM:  L hip flexion mildly tight, otherwise WNL  LOWER EXTREMITY MMT:    MMT Right eval Left eval  Hip flexion 4 4  Hip extension 4 4-  Hip abduction 4 4-  Hip adduction    Hip internal rotation    Hip external rotation    Knee flexion 5 5  Knee extension 5 5  Ankle dorsiflexion 5 5  Ankle plantarflexion    Ankle inversion    Ankle eversion     (Blank rows = not tested)  LUMBAR SPECIAL TESTS:  Straight leg raise test: Negative and Slump test: Negative  GAIT: Distance walked: In clinic distances Assistive device utilized: None Level of assistance: Complete Independence Comments: No major gait  deviations  TODAY'S TREATMENT:                                                                                                                              DATE:   03/28/23 UBE L 3 2 min each way Cable pulleys shld ext 10# and row 15# 2 sets 10 3# shld ext and IR with cane 15 x each Chest press with serratus 5# 2 sets 10 3# ER 2 sets 10 2# PNF 10 x- painful with compensation 2# empty can 10 x STS wt ball 10 x with chest press,10 x OH press PROM RT shld, STW RT lateral shld and upper arm muscles- painful IR Feet on ball bridge, KTC, obl and iso abdominals Lat pull down 20# 2 sets 10 Trunk ext black tband 2 sets 10 Act trunk ROM standing WNLs Act shld ROM standing RT = Left  excpet IR limited PASSIve FULL MMT RT shld 4+/5 except IR 4/5. Pain and func weakness noted   03/21/23 Nustep L 5 Cable pulleys shld ext 10# and row 15# 2 sets 10 Lat pull down 20# 2 sets 10 Trunk ext black tband 2 sets 10 Red tband 3 way stab Ball vs wall 5 x CW and 5 x CCW STS wt ball 10 x with chest press,10 x OH press PROM RT shld, STW RT lateral shld and upper arm muscles Feet on ball bridge, obl and iso abs Ionto RT lateal shld 1.2 cc  4 hour patch  03/14/23  Dyna disc pelvic ROM 4 way 10 each Red tband scap stab on dyna disc Nustep L 5 Ppt 10x hold 3 sec Bridge 10 x hold 3 sec Bridge with resisted hip flex and clams GTband Feet on ball bridge,KTC and obl Iso abdominals with ball  ( upper and lower) PROM LE and trunk STS with wt ball 10 x press, 10 x [facility]    03/07/23 Education    PATIENT EDUCATION:  Education details: POC Person educated: Patient Education method: Explanation Education comprehension: verbalized understanding  HOME EXERCISE PROGRAM:  Access Code: [email or web address] URL: [email or web address] Date: 03/14/2023 Prepared by: Taiwan Talcott  Exercises - Shoulder extension with resistance - Neutral  - 1 x daily - 7 x weekly - 2 sets - 10 reps - Standing  Shoulder Row with Anchored Resistance  - 1 x daily - 7 x weekly - 2 sets - 10 reps - Shoulder Internal Rotation with Resistance  - 1 x daily - 7 x weekly - 2 sets - 10 reps - Standing Shoulder External Rotation with Resistance  - 1 x daily - 7 x weekly - 2 sets - 10 reps - Standing Shoulder Horizontal Abduction with Resistance  - 1 x daily - 7 x weekly - 2 sets - 10 reps - Supine Bridge  - 1 x daily - 7 x weekly - 2 sets - 10 reps - Supine March  - 1 x daily - 7 x weekly - 2 sets - 10 reps  ASSESSMENT:  CLINICAL IMPRESSION: Act trunk ROM standing WNLs Act shld ROM standing RT = Left  excpet IR limited PASSIve FULL MMT RT shld 4+/5 except IR 4/5. Pain and func weakness noted with ex and states with ADLS and cleaning. Goals assessed and documented. OBJECTIVE IMPAIRMENTS: Abnormal gait, decreased activity tolerance, decreased balance, decreased endurance, decreased mobility, difficulty walking, decreased ROM, decreased strength, increased muscle spasms, impaired flexibility, improper body mechanics, postural dysfunction, and pain.   ACTIVITY LIMITATIONS: carrying, lifting, bending, and locomotion level  PARTICIPATION LIMITATIONS: meal prep, cleaning, laundry, community activity, and occupation  PERSONAL FACTORS: Past/current experiences are also affecting patient's functional outcome.   REHAB POTENTIAL: Good  CLINICAL DECISION MAKING: Stable/uncomplicated  EVALUATION COMPLEXITY: Low   GOALS: Goals reviewed with patient? Yes  SHORT TERM GOALS: Target date: 03/20/23  I with initial HEP Baseline: Goal status: 03/14/23 MET  LONG TERM GOALS: Target date: 05/16/23  I with final HEP Baseline:  Goal status: INITIAL  03/28/23 evolving  2.  Patient will recover R shoulder ROM and strength = L, with pain < 3/10 Baseline: Pain with any resisted motion, pain with A or PROM > 100 Goal status: INITIAL  03/28/23 progressing  3.  Increase L hip strength to at least 4+/5 Baseline: 4-/5 Goal  status: INITIAL  03/28/23 MET  4.  Patient will be able to lift 10# from floor and place at shoulder height without reports of LBP or R shoulder pain. Baseline: Avoiding lifting due to pain Goal status: INITIAL  5.  Patient will report LBP of < 3/10 after performing all normal daily activities. Baseline: 7 Goal status: INITIAL   03/28/23 progressing  6.  Patient will report R shoulder pain of < 3/10 after completing all normal daily activities. Baseline: 8 Goal status: INITIAL   03/28/23 progressing  PLAN:  PT FREQUENCY: 1-2x/week  PT DURATION: 10 weeks  PLANNED INTERVENTIONS: Therapeutic exercises, Therapeutic activity, Neuromuscular re-education, Balance training, Gait training, Patient/Family education, Self Care,  Joint mobilization, Dry Needling, Electrical stimulation, Spinal mobilization, Cryotherapy, Moist heat, Vasopneumatic device, Ultrasound, Ionotophoresis 4mg /ml Dexamethasone, and Manual therapy.  PLAN FOR NEXT SESSION: assess and progress. Rec calling MD for f/u and possible imaging for shoulder    Patient Details  Name: [name] MRN: [id number] Date of Birth: [birth year] Referring Provider:  Otho Darner, MD  Encounter Date: 03/28/2023   Suanne Marker, PTA 03/28/2023, 9:27 AM  [place] [facility] [address] [place], [place], [address] Phone: [phone]   Fax:  [phone] at [facility] [address] [place], [place], [address] Phone: [phone]   Fax:  [phone]  Patient Details  Name: [name] MRN: [id number] Date of Birth: 1981-06-22 Referring Provider:  Otho Darner, MD  Encounter Date: 03/28/2023   Suanne Marker, PTA 03/28/2023, 9:27 AM  [place] [facility] at [facility] [address] [place], [place], [address] Phone: [phone]   Fax:  [phone]  [facility] at [facility] [address] [place], [place], [address] Phone: [phone]   Fax:  [phone]  Patient Details  Name: [name] MRN: [id number] Date of Birth: [birth year] Referring Provider:  Otho Darner, MD  Encounter Date: 03/28/2023   Suanne Marker, PTA 03/28/2023, 9:27 AM  [place]  [facility] at [facility] [address] [place], [place], [address] Phone: [phone]   Fax:  [phone]

## 2023-03-29 NOTE — Therapy (Signed)
OUTPATIENT PHYSICAL THERAPY THORACOLUMBAR    Patient Name: [name] MRN: [id number] DOB:May 07, 1981, 42 y.o., female Today's Date: 03/31/2023  END OF SESSION:  PT End of Session - 03/31/23 0850     Visit Number 5    Date for PT Re-Evaluation 05/16/23    PT Start Time 0850    PT Stop Time 0930    PT Time Calculation (min) 40 min              Past Medical History:  Diagnosis Date   Asthma    Carpal tunnel syndrome    Vaginal Pap smear, abnormal    Past Surgical History:  Procedure Laterality Date   COLPOSCOPY     MYOMECTOMY N/A 10/04/2016   Procedure: MYOMECTOMY;  Surgeon: Willodean Rosenthal, MD;  Location: [facility];  Service: Gynecology;  Laterality: N/A;   TONSILLECTOMY     Patient Active Problem List   Diagnosis Date Noted   Fibroid uterus 10/04/2016   Postoperative anemia due to acute blood loss 10/04/2016   Postoperative state 10/04/2016   Enlarged uterus 06/28/2016    PCP: none  REFERRING PROVIDER: Otho Darner, MD  REFERRING DIAG: lumbar strain, rotator cuff syndrome  Rationale for Evaluation and Treatment: Rehabilitation  THERAPY DIAG:  Abnormal posture  Other lack of coordination  Muscle weakness (generalized)  Acute pain of right shoulder  Other low back pain  ONSET DATE: 02/15/23  SUBJECTIVE:                                                                                                                                                                                           SUBJECTIVE STATEMENT: Yesterday was very inflamed, today is better. No pain right now.   PERTINENT HISTORY:  R CT surgery  PAIN:  Are you having pain? Yes: NPRS scale: 4 back, 7shoulder/10 Pain location: Low back, L hip Pain description: pressure, numbness Aggravating factors: Sitting for long periods unsupported Relieving factors: Holding her elbow below shoulder height helps avoid pain.  PRECAUTIONS: None  WEIGHT BEARING RESTRICTIONS:  No  FALLS:  Has patient fallen in last 6 months? 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 Back can get stiff toward the end of the day. R shoulder pain increased  PLOF: Independent  PATIENT GOALS: Increase strength, ability to lift.  NEXT MD VISIT: About a month  OBJECTIVE:   DIAGNOSTIC FINDINGS:  Had X ray and MRI about 2 months ago, but doesn't  recall anything coming from it.  SCREENING FOR RED FLAGS: Bowel or bladder incontinence: No Spinal tumors: No Cauda equina syndrome: No Compression fracture: No Abdominal aneurysm: No  COGNITION: Overall cognitive status: Within functional limits for tasks assessed     SENSATION: WFL Reports H/O T & N extending into R arm  MUSCLE LENGTH: Hamstrings: Right 70 deg; Left 55 deg Thomas test: B hip flexors WNL  POSTURE: increased lumbar lordosis, stands with B knee hyperextension  PALPATION: TTP R UT, Rhomboids, L glut origins, Piriformis  UE TESTING: R shoulder PROM- pain at 110, able to go to 120, Shoulder abd, flex IR, ER limited due to pain in shoulder with any resistance,  Empty can, Hawkin's, lift off test (+), belly press, Yergasson's (-),   LUMBAR ROM:  WNL  LOWER EXTREMITY ROM:  L hip flexion mildly tight, otherwise WNL  LOWER EXTREMITY MMT:    MMT Right eval Left eval  Hip flexion 4 4  Hip extension 4 4-  Hip abduction 4 4-  Hip adduction    Hip internal rotation    Hip external rotation    Knee flexion 5 5  Knee extension 5 5  Ankle dorsiflexion 5 5  Ankle plantarflexion    Ankle inversion    Ankle eversion     (Blank rows = not tested)  LUMBAR SPECIAL TESTS:  Straight leg raise test: Negative and Slump test: Negative  GAIT: Distance walked: In clinic distances Assistive device utilized: None Level of  assistance: Complete Independence Comments: No major gait deviations  TODAY'S TREATMENT:                                                                                                                              DATE:  03/31/23 NuStep L5  x42mins  Rows and lats 25# 2x10  Blacktb ext 2x12  Shoulder ext 10# 2x10  AR press 10# 2x10  Deadbugs 2x10 alternating  AB roll ups 2x10 Bridges 2x10 greenTB horizontal abd 2x10    03/28/23 UBE L 3 2 min each way Cable pulleys shld ext 10# and row 15# 2 sets 10 3# shld ext and IR with cane 15 x each Chest press with serratus 5# 2 sets 10 3# ER 2 sets 10 2# PNF 10 x- painful with compensation 2# empty can 10 x STS wt ball 10 x with chest press,10 x OH press PROM RT shld, STW RT lateral shld and upper arm muscles- painful IR Feet on ball bridge, KTC, obl and iso abdominals Lat pull down 20# 2 sets 10 Trunk ext black tband 2 sets 10 Act trunk ROM standing WNLs Act shld ROM standing RT = Left  excpet IR limited PASSIve FULL MMT RT shld 4+/5 except IR 4/5. Pain and func weakness noted   03/21/23 Nustep L 5 Cable pulleys shld ext 10# and row 15# 2 sets 10 Lat pull down 20# 2 sets 10 Trunk ext black tband 2 sets  10 Red tband 3 way stab Ball vs wall 5 x CW and 5 x CCW STS wt ball 10 x with chest press,10 x OH press PROM RT shld, STW RT lateral shld and upper arm muscles Feet on ball bridge, obl and iso abs Ionto RT lateal shld 1.2 cc 4 hour patch  03/14/23  Dyna disc pelvic ROM 4 way 10 each Red tband scap stab on dyna disc Nustep L 5 Ppt 10x hold 3 sec Bridge 10 x hold 3 sec Bridge with resisted hip flex and clams GTband Feet on ball bridge,KTC and obl Iso abdominals with ball  ( upper and lower) PROM LE and trunk STS with wt ball 10 x press, 10 x [facility]    03/07/23 Education    PATIENT EDUCATION:  Education details: POC Person educated: Patient Education method: Explanation Education comprehension: verbalized  understanding  HOME EXERCISE PROGRAM:  Access Code: [email or web address] URL: [email or web address] Date: 03/14/2023 Prepared by: Angela Payseur  Exercises - Shoulder extension with resistance - Neutral  - 1 x daily - 7 x weekly - 2 sets - 10 reps - Standing Shoulder Row with Anchored Resistance  - 1 x daily - 7 x weekly - 2 sets - 10 reps - Shoulder Internal Rotation with Resistance  - 1 x daily - 7 x weekly - 2 sets - 10 reps - Standing Shoulder External Rotation with Resistance  - 1 x daily - 7 x weekly - 2 sets - 10 reps - Standing Shoulder Horizontal Abduction with Resistance  - 1 x daily - 7 x weekly - 2 sets - 10 reps - Supine Bridge  - 1 x daily - 7 x weekly - 2 sets - 10 reps - Supine March  - 1 x daily - 7 x weekly - 2 sets - 10 reps  ASSESSMENT:  CLINICAL IMPRESSION: patient returns with no pain in shoulder or back. She reports it was inflamed after increase use of it yesterday. Focused on some shoulder strengthening and core as well. Difficulty noted with deadbugs, some core weakness present. Will try to call her doctor again today to see if she can get an appt for som imaging.    OBJECTIVE IMPAIRMENTS: Abnormal gait, decreased activity tolerance, decreased balance, decreased endurance, decreased mobility, difficulty walking, decreased ROM, decreased strength, increased muscle spasms, impaired flexibility, improper body mechanics, postural dysfunction, and pain.   ACTIVITY LIMITATIONS: carrying, lifting, bending, and locomotion level  PARTICIPATION LIMITATIONS: meal prep, cleaning, laundry, community activity, and occupation  PERSONAL FACTORS: Past/current experiences are also affecting patient's functional outcome.   REHAB POTENTIAL: Good  CLINICAL DECISION MAKING: Stable/uncomplicated  EVALUATION COMPLEXITY: Low   GOALS: Goals reviewed with patient? Yes  SHORT TERM GOALS: Target date: 03/20/23  I with initial HEP Baseline: Goal status: 03/14/23 MET  LONG  TERM GOALS: Target date: 05/16/23  I with final HEP Baseline:  Goal status: INITIAL  03/28/23 evolving  2.  Patient will recover R shoulder ROM and strength = L, with pain < 3/10 Baseline: Pain with any resisted motion, pain with A or PROM > 100 Goal status: IN PROGRESS  03/28/23  3.  Increase L hip strength to at least 4+/5 Baseline: 4-/5 Goal status: MET  03/28/23   4.  Patient will be able to lift 10# from floor and place at shoulder height without reports of LBP or R shoulder pain. Baseline: Avoiding lifting due to pain Goal status: INITIAL  5.  Patient will report LBP of < 3/10 after  performing all normal daily activities. Baseline: 7 Goal status: IN PROGRESS   03/28/23   6.  Patient will report R shoulder pain of < 3/10 after completing all normal daily activities. Baseline: 8 Goal status: IN PROGRESS   03/28/23   PLAN:  PT FREQUENCY: 1-2x/week  PT DURATION: 10 weeks  PLANNED INTERVENTIONS: Therapeutic exercises, Therapeutic activity, Neuromuscular re-education, Balance training, Gait training, Patient/Family education, Self Care, Joint mobilization, Dry Needling, Electrical stimulation, Spinal mobilization, Cryotherapy, Moist heat, Vasopneumatic device, Ultrasound, Ionotophoresis 4mg /ml Dexamethasone, and Manual therapy.  PLAN FOR NEXT SESSION: assess and progress.    Patient Details  Name: [name] MRN: [id number] Date of Birth: 1981-09-20 Referring Provider:  Otho Darner, MD  Encounter Date: 03/31/2023   Cassie Freer, PT 03/31/2023, 9:29 AM  [place] [facility] at [facility] [address] [place], [place], [address] Phone: [phone]   Fax:  [phone] at [facility] [address]. [place], [place], [address] Phone: [phone]   Fax:  [phone]  Patient Details  Name: [name] MRN: [id number] Date of Birth: 1981/05/27 Referring Provider:  Otho Darner, MD  Encounter Date: 03/31/2023   Cassie Freer, PT 03/31/2023, 9:29 AM  [place] [facility] at [facility] [address] [place], [place], [address] Phone: [phone]   Fax:  [phone] at [facility] [address] [place], [place], [address] Phone: [phone]   Fax:  [phone]  Patient Details  Name: [name] MRN: [id number] Date of Birth: 05/06/1981 Referring Provider:  Otho Darner, MD  Encounter Date: 03/31/2023   Cassie Freer, PT 03/31/2023, 9:29 AM  [place]  Outpatient Rehabilitation at [facility] [address] [place], [place], [address] Phone: [phone]   Fax:  [phone]

## 2023-03-31 ENCOUNTER — Ambulatory Visit: Payer: Medicaid Other

## 2023-03-31 DIAGNOSIS — R293 Abnormal posture: Secondary | ICD-10-CM

## 2023-03-31 DIAGNOSIS — R278 Other lack of coordination: Secondary | ICD-10-CM

## 2023-03-31 DIAGNOSIS — M25511 Pain in right shoulder: Secondary | ICD-10-CM

## 2023-03-31 DIAGNOSIS — M5459 Other low back pain: Secondary | ICD-10-CM

## 2023-03-31 DIAGNOSIS — M6281 Muscle weakness (generalized): Secondary | ICD-10-CM

## 2023-04-04 ENCOUNTER — Ambulatory Visit: Payer: Medicaid Other | Admitting: Physical Therapy

## 2023-04-04 DIAGNOSIS — M25511 Pain in right shoulder: Secondary | ICD-10-CM

## 2023-04-04 DIAGNOSIS — R293 Abnormal posture: Secondary | ICD-10-CM

## 2023-04-04 DIAGNOSIS — M6281 Muscle weakness (generalized): Secondary | ICD-10-CM

## 2023-04-04 DIAGNOSIS — R278 Other lack of coordination: Secondary | ICD-10-CM

## 2023-04-04 DIAGNOSIS — M5459 Other low back pain: Secondary | ICD-10-CM

## 2023-04-04 NOTE — Therapy (Signed)
OUTPATIENT PHYSICAL THERAPY THORACOLUMBAR    Patient Name: [name] MRN: [id number] DOB:01/17/1981, 42 y.o., female Today's Date: 04/04/2023  END OF SESSION:  PT End of Session - 04/04/23 1143     Visit Number 6    Date for PT Re-Evaluation 05/16/23    PT Start Time 1145    PT Stop Time 1230    PT Time Calculation (min) 45 min    Activity Tolerance Patient tolerated treatment well    Behavior During Therapy [facility] for tasks assessed/performed              Past Medical History:  Diagnosis Date   Asthma    Carpal tunnel syndrome    Vaginal Pap smear, abnormal    Past Surgical History:  Procedure Laterality Date   COLPOSCOPY     MYOMECTOMY N/A 10/04/2016   Procedure: MYOMECTOMY;  Surgeon: Willodean Rosenthal, MD;  Location: [facility];  Service: Gynecology;  Laterality: N/A;   TONSILLECTOMY     Patient Active Problem List   Diagnosis Date Noted   Fibroid uterus 10/04/2016   Postoperative anemia due to acute blood loss 10/04/2016   Postoperative state 10/04/2016   Enlarged uterus 06/28/2016    PCP: none  REFERRING PROVIDER: Otho Darner, MD  REFERRING DIAG: lumbar strain, rotator cuff syndrome  Rationale for Evaluation and Treatment: Rehabilitation  THERAPY DIAG:  Abnormal posture  Other lack of coordination  Muscle weakness (generalized)  Acute pain of right shoulder  Other low back pain  ONSET DATE: 02/15/23  SUBJECTIVE:                                                                                                                                                                                           SUBJECTIVE STATEMENT: Can't set an appt for dr. Getting no answer. Feeling pressure in back, stiff but not pain  PERTINENT HISTORY:  R CT surgery  PAIN:  Are you having pain? Yes: NPRS scale:  shoulder 5/10 Pain location: Low back, L hip Pain description: pressure, numbness Aggravating factors: Sitting for long periods  unsupported Relieving factors: Holding her elbow below shoulder height helps avoid pain.  PRECAUTIONS: None  WEIGHT BEARING RESTRICTIONS: No  FALLS:  Has patient fallen in last 6 months? 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 Back can get stiff toward the end of the day. R shoulder pain increased  PLOF: Independent  PATIENT GOALS: Increase strength, ability to lift.  NEXT MD VISIT: About a month  OBJECTIVE:   DIAGNOSTIC FINDINGS:  Had X ray and MRI about 2 months ago, but doesn't recall anything coming from it.  SCREENING FOR RED FLAGS: Bowel or bladder incontinence: No Spinal tumors: No Cauda equina syndrome: No Compression fracture: No Abdominal aneurysm: No  COGNITION: Overall cognitive status: Within functional limits for tasks assessed     SENSATION: WFL Reports H/O T & N extending into R arm  MUSCLE LENGTH: Hamstrings: Right 70 deg; Left 55 deg Thomas test: B hip flexors WNL  POSTURE: increased lumbar lordosis, stands with B knee hyperextension  PALPATION: TTP R UT, Rhomboids, L glut origins, Piriformis  UE TESTING: R shoulder PROM- pain at 110, able to go to 120, Shoulder abd, flex IR, ER limited due to pain in shoulder with any resistance,  Empty can, Hawkin's, lift off test (+), belly press, Yergasson's (-),   LUMBAR ROM:  WNL  LOWER EXTREMITY ROM:  L hip flexion mildly tight, otherwise WNL  LOWER EXTREMITY MMT:    MMT Right eval Left eval  Hip flexion 4 4  Hip extension 4 4-  Hip abduction 4 4-  Hip adduction    Hip internal rotation    Hip external rotation    Knee flexion 5 5  Knee extension 5 5  Ankle dorsiflexion 5 5  Ankle plantarflexion    Ankle inversion    Ankle eversion     (Blank rows = not tested)  LUMBAR SPECIAL TESTS:   Straight leg raise test: Negative and Slump test: Negative  GAIT: Distance walked: In clinic distances Assistive device utilized: None Level of assistance: Complete Independence Comments: No major gait deviations  TODAY'S TREATMENT:                                                                                                                              DATE:   04/04/23 UBE L2 3 mins each way Upward rows/bicep curls/tricep ext 15 lbs 2x10 Lat pull downs 25 lb 2x12 Rows/ER/IR/hor [id number] Shoulder rolls x5 each way D2/D1 shoulder flexion with green t-band for more range OHP yellow ball 2x12 Towel climbs against wall with abd and back 2x10 Seated bent over pec fly 2 lb 2x10   03/31/23 NuStep L5  x72mins  Rows and lats 25# 2x10  Blacktb ext 2x12  Shoulder ext 10# 2x10  AR press 10# 2x10  Deadbugs 2x10 alternating  AB roll ups 2x10 Bridges 2x10 greenTB horizontal abd 2x10    03/28/23 UBE L 3 2 min each way Cable pulleys shld ext 10# and row 15# 2 sets 10 3# shld ext and IR with cane 15 x each Chest press with serratus 5# 2 sets 10 3# ER 2 sets 10 2# PNF 10 x- painful with compensation 2# empty can 10 x STS wt ball 10 x with chest press,10 x OH press PROM RT shld, STW RT lateral shld and upper arm muscles- painful IR  Feet on ball bridge, KTC, obl and iso abdominals Lat pull down 20# 2 sets 10 Trunk ext black tband 2 sets 10 Act trunk ROM standing WNLs Act shld ROM standing RT = Left  excpet IR limited PASSIve FULL MMT RT shld 4+/5 except IR 4/5. Pain and func weakness noted   03/21/23 Nustep L 5 Cable pulleys shld ext 10# and row 15# 2 sets 10 Lat pull down 20# 2 sets 10 Trunk ext black tband 2 sets 10 Red tband 3 way stab Ball vs wall 5 x CW and 5 x CCW STS wt ball 10 x with chest press,10 x OH press PROM RT shld, STW RT lateral shld and upper arm muscles Feet on ball bridge, obl and iso abs Ionto RT lateal shld 1.2 cc 4 hour patch  03/14/23  Dyna disc  pelvic ROM 4 way 10 each Red tband scap stab on dyna disc Nustep L 5 Ppt 10x hold 3 sec Bridge 10 x hold 3 sec Bridge with resisted hip flex and clams GTband Feet on ball bridge,KTC and obl Iso abdominals with ball  ( upper and lower) PROM LE and trunk STS with wt ball 10 x press, 10 x [facility]    03/07/23 Education    PATIENT EDUCATION:  Education details: POC Person educated: Patient Education method: Explanation Education comprehension: verbalized understanding  HOME EXERCISE PROGRAM:  Access Code: [email or web address] URL: [email or web address] Date: 03/14/2023 Prepared by: Angela Payseur  Exercises - Shoulder extension with resistance - Neutral  - 1 x daily - 7 x weekly - 2 sets - 10 reps - Standing Shoulder Row with Anchored Resistance  - 1 x daily - 7 x weekly - 2 sets - 10 reps - Shoulder Internal Rotation with Resistance  - 1 x daily - 7 x weekly - 2 sets - 10 reps - Standing Shoulder External Rotation with Resistance  - 1 x daily - 7 x weekly - 2 sets - 10 reps - Standing Shoulder Horizontal Abduction with Resistance  - 1 x daily - 7 x weekly - 2 sets - 10 reps - Supine Bridge  - 1 x daily - 7 x weekly - 2 sets - 10 reps - Supine March  - 1 x daily - 7 x weekly - 2 sets - 10 reps  ASSESSMENT:  CLINICAL IMPRESSION: Patient stated she can't clean her house because sustained overhead motions cause pain, so we did some overhead motions that she tolerated well with no pain. She avoids going to the gym because she doesn't want to cause more pain, I told her that muscle soreness is a good pain. Some verbal cues needed during D1 and D2 flexion for correct form, which she received well and was able to correctly perform the exercises. She reported shoulder pain before but not after the session, just tired. We focused on shoulder strengthening which she tolerated well and she would benefit from continued PT in order to increase her strength and resume functional activities  without pain.   OBJECTIVE IMPAIRMENTS: Abnormal gait, decreased activity tolerance, decreased balance, decreased endurance, decreased mobility, difficulty walking, decreased ROM, decreased strength, increased muscle spasms, impaired flexibility, improper body mechanics, postural dysfunction, and pain.   ACTIVITY LIMITATIONS: carrying, lifting, bending, and locomotion level  PARTICIPATION LIMITATIONS: meal prep, cleaning, laundry, community activity, and occupation  PERSONAL FACTORS: Past/current experiences are also affecting patient's functional outcome.   REHAB POTENTIAL: Good  CLINICAL DECISION MAKING: Stable/uncomplicated  EVALUATION COMPLEXITY: Low   GOALS: Goals  reviewed with patient? Yes  SHORT TERM GOALS: Target date: 03/20/23  I with initial HEP Baseline: Goal status: 03/14/23 MET  LONG TERM GOALS: Target date: 05/16/23  I with final HEP Baseline:  Goal status: INITIAL  03/28/23 evolving  2.  Patient will recover R shoulder ROM and strength = L, with pain < 3/10 Baseline: Pain with any resisted motion, pain with A or PROM > 100 Goal status: IN PROGRESS  03/28/23  3.  Increase L hip strength to at least 4+/5 Baseline: 4-/5 Goal status: MET  03/28/23   4.  Patient will be able to lift 10# from floor and place at shoulder height without reports of LBP or R shoulder pain. Baseline: Avoiding lifting due to pain Goal status: INITIAL  5.  Patient will report LBP of < 3/10 after performing all normal daily activities. Baseline: 7 Goal status: IN PROGRESS   03/28/23   6.  Patient will report R shoulder pain of < 3/10 after completing all normal daily activities. Baseline: 8 Goal status: IN PROGRESS   03/28/23   PLAN:  PT FREQUENCY: 1-2x/week  PT DURATION: 10 weeks  PLANNED INTERVENTIONS: Therapeutic exercises, Therapeutic activity, Neuromuscular re-education, Balance training, Gait training, Patient/Family education, Self Care, Joint mobilization, Dry Needling, Electrical  stimulation, Spinal mobilization, Cryotherapy, Moist heat, Vasopneumatic device, Ultrasound, Ionotophoresis 4mg /ml Dexamethasone, and Manual therapy.  PLAN FOR NEXT SESSION: continue with shoulder strengthening   Patient Details  Name: CARLOS QUACKENBUSH MRN: [id number] Date of Birth: Oct 21, 1980 Referring Provider:  Otho Darner, MD  Encounter Date: 04/04/2023   George Ina, SPTA 04/04/2023, 11:44 AM

## 2023-04-06 ENCOUNTER — Ambulatory Visit: Payer: Medicaid Other | Admitting: Physical Therapy

## 2023-04-11 ENCOUNTER — Ambulatory Visit: Payer: Medicaid Other | Admitting: Physical Therapy

## 2023-04-13 ENCOUNTER — Ambulatory Visit: Payer: Medicaid Other | Admitting: Physical Therapy
